# Patient Record
Sex: Female | Born: 1980 | Race: White | Hispanic: No | Marital: Married | Smoking: Never smoker
Health system: Southern US, Community
[De-identification: ages and names within clinical notes are randomized; demographics above are authoritative.]

## PROBLEM LIST (undated history)

## (undated) DIAGNOSIS — S42309A Unspecified fracture of shaft of humerus, unspecified arm, initial encounter for closed fracture: Secondary | ICD-10-CM

## (undated) DIAGNOSIS — Z789 Other specified health status: Secondary | ICD-10-CM

## (undated) DIAGNOSIS — R519 Headache, unspecified: Secondary | ICD-10-CM

## (undated) DIAGNOSIS — G43909 Migraine, unspecified, not intractable, without status migrainosus: Secondary | ICD-10-CM

## (undated) DIAGNOSIS — F419 Anxiety disorder, unspecified: Secondary | ICD-10-CM

## (undated) DIAGNOSIS — F32A Depression, unspecified: Secondary | ICD-10-CM

## (undated) DIAGNOSIS — F329 Major depressive disorder, single episode, unspecified: Secondary | ICD-10-CM

## (undated) DIAGNOSIS — R51 Headache: Secondary | ICD-10-CM

## (undated) HISTORY — DX: Anxiety disorder, unspecified: F41.9

## (undated) HISTORY — DX: Headache: R51

## (undated) HISTORY — DX: Unspecified fracture of shaft of humerus, unspecified arm, initial encounter for closed fracture: S42.309A

## (undated) HISTORY — DX: Major depressive disorder, single episode, unspecified: F32.9

## (undated) HISTORY — PX: WISDOM TOOTH EXTRACTION: SHX21

## (undated) HISTORY — PX: DILATION AND CURETTAGE OF UTERUS: SHX78

## (undated) HISTORY — DX: Depression, unspecified: F32.A

## (undated) HISTORY — DX: Headache, unspecified: R51.9

## (undated) HISTORY — DX: Migraine, unspecified, not intractable, without status migrainosus: G43.909

---

## 2003-01-03 ENCOUNTER — Other Ambulatory Visit: Admission: RE | Admit: 2003-01-03 | Discharge: 2003-01-03 | Payer: Self-pay | Admitting: *Deleted

## 2004-01-10 ENCOUNTER — Other Ambulatory Visit: Admission: RE | Admit: 2004-01-10 | Discharge: 2004-01-10 | Payer: Self-pay | Admitting: Gynecology

## 2005-01-04 ENCOUNTER — Other Ambulatory Visit: Admission: RE | Admit: 2005-01-04 | Discharge: 2005-01-04 | Payer: Self-pay | Admitting: Obstetrics and Gynecology

## 2008-02-04 ENCOUNTER — Emergency Department (HOSPITAL_COMMUNITY): Admission: EM | Admit: 2008-02-04 | Discharge: 2008-02-04 | Payer: Self-pay | Admitting: Family Medicine

## 2008-05-16 ENCOUNTER — Ambulatory Visit (HOSPITAL_COMMUNITY): Admission: RE | Admit: 2008-05-16 | Discharge: 2008-05-16 | Payer: Self-pay | Admitting: Obstetrics and Gynecology

## 2008-05-16 ENCOUNTER — Encounter (INDEPENDENT_AMBULATORY_CARE_PROVIDER_SITE_OTHER): Payer: Self-pay | Admitting: Obstetrics and Gynecology

## 2009-04-30 ENCOUNTER — Emergency Department (HOSPITAL_COMMUNITY): Admission: EM | Admit: 2009-04-30 | Discharge: 2009-04-30 | Payer: Self-pay | Admitting: Emergency Medicine

## 2009-07-17 ENCOUNTER — Inpatient Hospital Stay (HOSPITAL_COMMUNITY): Admission: AD | Admit: 2009-07-17 | Discharge: 2009-07-20 | Payer: Self-pay | Admitting: Obstetrics and Gynecology

## 2009-07-18 ENCOUNTER — Encounter (INDEPENDENT_AMBULATORY_CARE_PROVIDER_SITE_OTHER): Payer: Self-pay | Admitting: Obstetrics and Gynecology

## 2011-04-04 LAB — CBC
HCT: 34.2 % — ABNORMAL LOW (ref 36.0–46.0)
Hemoglobin: 13.4 g/dL (ref 12.0–15.0)
MCHC: 34.5 g/dL (ref 30.0–36.0)
MCV: 89.2 fL (ref 78.0–100.0)
MCV: 89.3 fL (ref 78.0–100.0)
Platelets: 236 10*3/uL (ref 150–400)
RBC: 4.36 MIL/uL (ref 3.87–5.11)
RDW: 13.3 % (ref 11.5–15.5)

## 2011-05-11 NOTE — Op Note (Signed)
NAME:  [name], [name] NO.:  [id number]   MEDICAL RECORD NO.:  [id number]          PATIENT TYPE:  INP   LOCATION:  9111                          FACILITY:  [facility]   PHYSICIAN:  Guy Sandifer. Henderson Cloud, M.D. DATE OF BIRTH:  03-May-1981   DATE OF PROCEDURE:  07/18/2009  DATE OF DISCHARGE:                               OPERATIVE REPORT   PROCEDURE:  Vacuum extraction.   DESCRIPTION OF PROCEDURE:  This patient is a 30 year old married white  female G2, P0 with an EDC of July 18, 2009, who was admitted on the  evening of July 17, 2009, for two-stage induction of labor.  Prenatal  care has been uncomplicated.  She has a positive group B beta strep  culture.  She undergo Cytotec induction.  Antibiotics were started per  beta strep protocol.  At 5 a.m. on July 18, 2009, Cervix was 1-2 cm  dilated, 70% effaced, -3 in vertex.  At 9:55 a.m., the cervix was 2 cm  dilated, 90% effaced, -2 station.  The patient had some decelerations  and good accelerations noted.  Artificial rupture of membranes for clear  fluid was carried out and an internal uterine pressure catheter and  fetal scalp electrode were placed.  The patient was on Pitocin.  As at  5:10 p.m., cervix was 3, 90%, and -2 with adequate labor.  Recheck at  7:10 p.m. reveals 3-4 cm dilation to complete effacement, -2 station.  The patient progresses to complete in pushing.  She had been pushing for  approximately 1-1/2 hours.  Vertex was +3 between pushes.  She was  pushing against the perineal body and not making much progress over the  last approximately 1 hour.  There were variable shape decelerations that  were late in occurrence.  Oxygen was administered.  Fetal heart rate in  the 170s and the maternal temperature was afebrile.  There was a band of  scar tissue presumably from the hymenal caruncle extending from a 12  o'clock position below the urethra to the 8 o'clock position of the  vaginal introitus.  This was not  reducing as the patient pushes.  Therefore, it was taken down by ligating at 2 points with 2-0 Rapide and  transecting the middle.  The patient continues to push.  After  discussion with the patient due to the above factors, lidocaine was  administered and a second-degree midline episiotomy was done.  The  patient pushes for 2 more contractions with some progress although the  fetal tachycardia remained and decelerations remained.  Therefore,  vacuum extraction was discussed with the patient and her husband,  1:40,000 risk of severe morbidity or mortality was reviewed.  All  questions were answered.  The patient straight catheterized.  Kiwi  vacuum extractor was placed.  There was one pop-off.  Over the course of  approximately 3 contractions, the vertex was delivered in the ROA  position.  A nuchal cord was reduced out.  Shoulders then delivered  without difficulty with McRoberts and the above described a second-  degree midline episiotomy.  Oronasal pharynx was suctioned.  Good  cry  and tone was noted.  Cord was clamped and cut.  Viable female infant,  Apgars of 8 and 9 was obtained 1 at 5 minutes.  Arterial cord pH of 7.21  was noted.  Birth weight was pending at time of dictation.  Placenta had  3 vessels intact and sent to Pathology.  The cervix and vagina without  laceration.  The midline episiotomy was repaired in the standard fashion  in layers.  The patient was stable in the labor and delivery room.  Estimated blood loss was 500 mL.  There was a question of the baby's  shoulders being uneven, so it was taken to the nursery for observation.      Guy Sandifer Henderson Cloud, M.D.  Electronically Signed     JET/MEDQ  D:  07/19/2009  T:  07/19/2009  Job:  [id number]

## 2011-05-11 NOTE — Op Note (Signed)
NAME:  [name], [name] NO.:  [id number]   MEDICAL RECORD NO.:  [id number]          PATIENT TYPE:  AMB   LOCATION:  SDC                           [facility]  [facility]   PHYSICIAN:  Duke Salvia. Marcelle Overlie, M.D.DATE OF BIRTH:  04-09-1981   DATE OF PROCEDURE:  DATE OF DISCHARGE:                               OPERATIVE REPORT   PREOPERATIVE DIAGNOSIS:  Missed abortion.   POSTOPERATIVE DIAGNOSIS:  Missed abortion.   PROCEDURE:  Dilation and evacuation.   SURGEON:  Meriel Pica, MD   ANESTHESIA:  Sedation plus paracervical block.   SPECIMENS REMOVED:  Products of conception.   BLEEDING:  Minimal.   PROCEDURE AND FINDINGS:  The patient was brought to the operating room  and after an adequate level of sedation was obtained, the patient's legs  stirrups, perineum and vagina prepped and drape with Betadine.  The  bladder was drained, EUA carried out uterus 7-week size mid position,  adnexa negative.  Speculum was position.  Cervix grasped with tenaculum.  Paracervical block created by infiltrating at 3 and 9 o'clock  submucosally 5-10 mL of 1% Xylocaine on either side after negative  aspiration.  The uterus then sounded to 8 cm, progressively dilated to a  27 Pratt dilator.  Seven curved suction curette was then used to curette  a moderate amount of tissue.  No further tissue could be removed.  A  small blunt curette was used to explore the cavity revealing no further  tissue and the walls were firm.  She had minimal bleeding.  She  tolerated this well, went to recovery room in good condition.      Richard M. Marcelle Overlie, M.D.  Electronically Signed     RMH/MEDQ  D:  05/16/2008  T:  05/17/2008  Job:  [id number]

## 2011-05-11 NOTE — H&P (Signed)
NAME:  [name], [name] NO.:  [id number]   MEDICAL RECORD NO.:  [id number]         PATIENT TYPE:  AMB   LOCATION:                                [facility]   PHYSICIAN:  Duke Salvia. Marcelle Overlie, M.D.DATE OF BIRTH:  March 01, 1981   DATE OF ADMISSION:  05/16/2008  DATE OF DISCHARGE:                              HISTORY & PHYSICAL   CHIEF COMPLAINT:  Missed AB.   HISTORY OF PRESENT ILLNESS:  A 30 year old G1, P0, EDD December 23.  Her  screening ultrasound several weeks ago showed a positive FHR with a slow  heart rate in the 60's.  She has had no spotting or bleeding.  Presented  this week for her new OB appointment. Ultrasound showed a 6 weeks fetus;  resolving corpus luteum, no fetal heart activity noted.  She presents  now for D&E.  This procedure including risks related to bleeding,  infection, other complications may require additional surgery.  Reviewed  with her which she understands and accepts.   ALLERGIES:  None.   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   CURRENT MEDICATIONS:  Her only current medications are prenatal  vitamins.   FAMILY HISTORY:  Please see her hospital form for details of family  history.   PHYSICAL EXAMINATION:  VITAL SIGNS:  Temperature 98.2, blood pressure  130/70.  HEENT:  Unremarkable.  NECK:  Supple without masses.  LUNGS:  Clear.  CARDIOVASCULAR:  Regular rate and rhythm without murmurs, rubs, gallops.  BREASTS:  Without masses.  ABDOMEN:.  Soft, flat, nontender.  PELVIC:  Normal external, vagina clear.  Uterus 7-week size, mid  position.  Adnexa negative.  EXTREMITIES/NEUROLOGIC:  Unremarkable.   IMPRESSION:  Missed abortion.   PLAN:  D&E procedure and risks reviewed as above.      Richard M. Marcelle Overlie, M.D.  Electronically Signed     RMH/MEDQ  D:  05/16/2008  T:  05/16/2008  Job:  [id number]

## 2011-09-22 LAB — CBC
HCT: 37.4
Platelets: 308
RDW: 12.8

## 2012-08-02 ENCOUNTER — Other Ambulatory Visit: Payer: Self-pay

## 2012-08-02 LAB — OB RESULTS CONSOLE RPR: RPR: NONREACTIVE

## 2012-08-02 LAB — OB RESULTS CONSOLE RUBELLA ANTIBODY, IGM: Rubella: IMMUNE

## 2012-08-02 LAB — OB RESULTS CONSOLE ANTIBODY SCREEN: Antibody Screen: NEGATIVE

## 2012-08-02 LAB — OB RESULTS CONSOLE HEPATITIS B SURFACE ANTIGEN: Hepatitis B Surface Ag: NEGATIVE

## 2012-10-16 ENCOUNTER — Other Ambulatory Visit (HOSPITAL_COMMUNITY): Payer: Self-pay | Admitting: Obstetrics and Gynecology

## 2012-10-16 DIAGNOSIS — O283 Abnormal ultrasonic finding on antenatal screening of mother: Secondary | ICD-10-CM

## 2012-10-16 DIAGNOSIS — Z3689 Encounter for other specified antenatal screening: Secondary | ICD-10-CM

## 2012-10-18 ENCOUNTER — Encounter (HOSPITAL_COMMUNITY): Payer: Self-pay

## 2012-10-18 ENCOUNTER — Ambulatory Visit (HOSPITAL_COMMUNITY)
Admission: RE | Admit: 2012-10-18 | Discharge: 2012-10-18 | Disposition: A | Discharge status: Home / Self Care | Payer: BC Managed Care – PPO | Source: Ambulatory Visit | Attending: Obstetrics and Gynecology | Admitting: Obstetrics and Gynecology

## 2012-10-18 VITALS — BP 107/68 | HR 100 | Wt 133.5 lb

## 2012-10-18 DIAGNOSIS — Z3689 Encounter for other specified antenatal screening: Secondary | ICD-10-CM

## 2012-10-18 DIAGNOSIS — Z363 Encounter for antenatal screening for malformations: Secondary | ICD-10-CM | POA: Insufficient documentation

## 2012-10-18 DIAGNOSIS — O283 Abnormal ultrasonic finding on antenatal screening of mother: Secondary | ICD-10-CM

## 2012-10-18 DIAGNOSIS — O358XX0 Maternal care for other (suspected) fetal abnormality and damage, not applicable or unspecified: Secondary | ICD-10-CM | POA: Insufficient documentation

## 2012-10-18 DIAGNOSIS — Z1389 Encounter for screening for other disorder: Secondary | ICD-10-CM | POA: Insufficient documentation

## 2012-10-18 NOTE — Progress Notes (Signed)
[name]  was seen today for an ultrasound appointment.  See full report in [facility].  Alpha Gula, MD  Comments:  [name] is seen today due to a right cystic kidney that was noted on recent ultrasound.  On ultrasound today, an enlarged, right sided multicystic dysplastic kidney is noted.  The right kidney length is 4.4 cm (> 2SD).  Multiple cysts are noted - the larges 1.5 x 1.4 cm.  The left kidney and bladder appear normal.  Normal amniotic fluid volume is appreciated that would suggest a normal functioning left kidney.  The remainder of the fetal anatomy is within normal limits.  Findings and limitations of the study were discussed.    Impression:  Single IUP at 19 5/7 weeks Right multicystic / dysplastic kidney noted Normal left kidney and bladder The remainder of the fetal anatomy is within normal limits Normal amniotic fluid volume  Recommendations:  Recommend follow up ultrasound in 4 weeks for growth and to reevaluate the fetal kidney. Will make arrangements for [facility]rology consult - feel that the patient will be able to deliver in [place], but will likely require evaluation after delivery.

## 2012-11-15 ENCOUNTER — Encounter (HOSPITAL_COMMUNITY): Payer: Self-pay

## 2012-11-15 ENCOUNTER — Ambulatory Visit (HOSPITAL_COMMUNITY)
Admission: RE | Admit: 2012-11-15 | Discharge: 2012-11-15 | Disposition: A | Discharge status: Home / Self Care | Payer: BC Managed Care – PPO | Source: Ambulatory Visit | Attending: Obstetrics and Gynecology | Admitting: Obstetrics and Gynecology

## 2012-11-15 VITALS — BP 112/63 | HR 67 | Wt 136.5 lb

## 2012-11-15 DIAGNOSIS — O283 Abnormal ultrasonic finding on antenatal screening of mother: Secondary | ICD-10-CM

## 2012-11-15 DIAGNOSIS — O358XX0 Maternal care for other (suspected) fetal abnormality and damage, not applicable or unspecified: Secondary | ICD-10-CM | POA: Insufficient documentation

## 2012-11-15 NOTE — Progress Notes (Signed)
[name]  was seen today for an ultrasound appointment.  See full report in [facility].  Impression: Single IUP at 23 5/7 weeks Large right multicystic dysplastic kidney noted (kidney length 6.1 cm - mean for current gestational age 33 cm).  The largest cyst is 4.6 cm in length. Normal left kidney and bladder The remainder of the fetal anatomy is within normal limits Overall fetal growth is appropriate; the AC > 97th%tile, largely due to the enlarged right kidney Normal amniotic fluid volume  Recommendations: Follow up ultrasound in 4 weeks to reevaluate. May require Cesarean delivery due to the enlarged right kidney. Peds urology appointment scheduled next week. Feel that the patient may safely delivery at [facility], but will require Peds urology evaluation after delivery.  Alpha Gula, MD

## 2012-12-13 ENCOUNTER — Ambulatory Visit (HOSPITAL_COMMUNITY): Payer: BC Managed Care – PPO

## 2012-12-14 ENCOUNTER — Ambulatory Visit (HOSPITAL_COMMUNITY)
Admission: RE | Admit: 2012-12-14 | Discharge: 2012-12-14 | Disposition: A | Discharge status: Home / Self Care | Payer: BC Managed Care – PPO | Source: Ambulatory Visit | Attending: Obstetrics and Gynecology | Admitting: Obstetrics and Gynecology

## 2012-12-14 VITALS — BP 125/63 | HR 120 | Wt 143.8 lb

## 2012-12-14 DIAGNOSIS — O358XX0 Maternal care for other (suspected) fetal abnormality and damage, not applicable or unspecified: Secondary | ICD-10-CM | POA: Insufficient documentation

## 2012-12-14 DIAGNOSIS — O283 Abnormal ultrasonic finding on antenatal screening of mother: Secondary | ICD-10-CM

## 2012-12-14 DIAGNOSIS — IMO0002 Reserved for concepts with insufficient information to code with codable children: Secondary | ICD-10-CM

## 2012-12-14 DIAGNOSIS — Z3689 Encounter for other specified antenatal screening: Secondary | ICD-10-CM | POA: Insufficient documentation

## 2012-12-14 NOTE — Progress Notes (Signed)
[facility]  Indication: 31 yr old G3P1011 at [redacted]w[redacted]d with right multicystic enlarged kidney for follow up ultrasound.  Findings: 1. Single intrauterine pregnancy. 2. Estimated fetal weight is in the 82nd%. The abdominal circumference is in the >97th%. 3. Anterior placenta without evidence of previa. 4. Normal amniotic fluid index. 5. Again seen is an enlarged multicystic right kidney. There are several large cysts the largest measuring 6cm. 6. The remainder of the limited anatomy survey is normal. The left kidney appears normal.  Recommendations: 1. Appropriate fetal growth. Accelerated abdominal circumference is likely due to the enlarged kidney. Recommend reevaluate closer to delivery for recommendation on mode of delivery. 2. Multicystic kidney: - previously counseled - has met with Pediatric Urology- needs neonatal follow up - recommend follow up every 2 week to evaluate amniotic fluid index - recommend fetal growth in 4 weeks  Eulis Foster, MD

## 2012-12-28 ENCOUNTER — Ambulatory Visit (HOSPITAL_COMMUNITY)
Admission: RE | Admit: 2012-12-28 | Discharge: 2012-12-28 | Disposition: A | Discharge status: Home / Self Care | Payer: BC Managed Care – PPO | Source: Ambulatory Visit | Attending: Obstetrics and Gynecology | Admitting: Obstetrics and Gynecology

## 2012-12-28 DIAGNOSIS — Z3689 Encounter for other specified antenatal screening: Secondary | ICD-10-CM | POA: Insufficient documentation

## 2012-12-28 DIAGNOSIS — IMO0002 Reserved for concepts with insufficient information to code with codable children: Secondary | ICD-10-CM

## 2012-12-28 DIAGNOSIS — O358XX0 Maternal care for other (suspected) fetal abnormality and damage, not applicable or unspecified: Secondary | ICD-10-CM | POA: Insufficient documentation

## 2012-12-28 NOTE — Progress Notes (Signed)
IVELIS NORGARD  was seen today for an ultrasound appointment.  See full report in [facility].  Impression: Single IUP at 29 6/7 weeks Large right multicystic dysplastic kidney; normal left kidney and bladder Limited ultrasound performed for amniotic fluid volume assessment - AFI: 19 cm. Patient seen by [facility] urology - plan delivery at [facility] with [facility] urology evaluation after delivery  Recommendations: Recommend follow-up ultrasound examination in 2 weeks for interval growth and to reasses the fetal kidneys.  Alpha Gula, MD

## 2013-01-08 ENCOUNTER — Other Ambulatory Visit (HOSPITAL_COMMUNITY): Payer: Self-pay | Admitting: Obstetrics and Gynecology

## 2013-01-08 DIAGNOSIS — O358XX0 Maternal care for other (suspected) fetal abnormality and damage, not applicable or unspecified: Secondary | ICD-10-CM

## 2013-01-11 ENCOUNTER — Ambulatory Visit (HOSPITAL_COMMUNITY)
Admission: RE | Admit: 2013-01-11 | Discharge: 2013-01-11 | Disposition: A | Discharge status: Home / Self Care | Payer: BC Managed Care – PPO | Source: Ambulatory Visit | Attending: Obstetrics and Gynecology | Admitting: Obstetrics and Gynecology

## 2013-01-11 VITALS — BP 107/67 | HR 85 | Wt 143.0 lb

## 2013-01-11 DIAGNOSIS — O358XX0 Maternal care for other (suspected) fetal abnormality and damage, not applicable or unspecified: Secondary | ICD-10-CM | POA: Insufficient documentation

## 2013-01-11 DIAGNOSIS — Z3689 Encounter for other specified antenatal screening: Secondary | ICD-10-CM | POA: Insufficient documentation

## 2013-01-11 NOTE — Progress Notes (Signed)
[name]  was seen today for an ultrasound appointment.  See full report in [facility].  Impression: Single IUP at 31 6/7 weeks Large right multicystic dysplastic kidney; normal left kidney and bladder The estimated fetal weight today is >90th %tile, but mostly secondary to the enlarged AC (> 97th %tile) due to the enlarged right kidney The remainder of the fetal anatomy is normal Normal amniotic fluid volume  Recommendations: Recommend follow-up ultrasound examination in 2 weeks amniotic fluid assessment and 4 weeks for interval growth. Based on enlarged [facility], will likely require cesarean delivery - will reassess at next growth scan Patient was seen by Peds Urology - plans delivery at [facility] with neonatal follow up.  Alpha Gula, MD

## 2013-01-25 ENCOUNTER — Other Ambulatory Visit (HOSPITAL_COMMUNITY): Payer: Self-pay | Admitting: Maternal and Fetal Medicine

## 2013-01-25 ENCOUNTER — Ambulatory Visit (HOSPITAL_COMMUNITY)
Admission: RE | Admit: 2013-01-25 | Discharge: 2013-01-25 | Disposition: A | Discharge status: Home / Self Care | Payer: BC Managed Care – PPO | Source: Ambulatory Visit | Attending: Obstetrics and Gynecology | Admitting: Obstetrics and Gynecology

## 2013-01-25 ENCOUNTER — Encounter (HOSPITAL_COMMUNITY): Payer: Self-pay

## 2013-01-25 VITALS — BP 127/73 | HR 100 | Wt 150.0 lb

## 2013-01-25 DIAGNOSIS — O358XX0 Maternal care for other (suspected) fetal abnormality and damage, not applicable or unspecified: Secondary | ICD-10-CM | POA: Insufficient documentation

## 2013-01-25 NOTE — Progress Notes (Signed)
[name]  was seen today for an ultrasound appointment.  See full report in [facility].  Impression: Single IUP at 33 6/7 weeks Large right multicystic dysplastic kidney; normal left kidney and bladder The estimated fetal weight today is again  >90th %tile, but mostly secondary to the enlarged AC (> 97th %tile) due to the enlarged right kidney The remainder of the fetal anatomy is normal Normal amniotic fluid volume  Recommendations: Recommend weekly BPPs/ assessment of amniotic fluid volumes- assuming normal amniotic fluid volumes, early delivery is not recommended. Based on enlarged AC (> 97th %tile), recommend cesarean delivery at 39 weeks to avoid abdominal trauma / abdominal dystocia. Follow up ultrasound for growth in 3-4 weeks.  Alpha Gula, MD

## 2013-01-26 ENCOUNTER — Other Ambulatory Visit (HOSPITAL_COMMUNITY): Payer: Self-pay | Admitting: Maternal and Fetal Medicine

## 2013-01-26 DIAGNOSIS — O358XX0 Maternal care for other (suspected) fetal abnormality and damage, not applicable or unspecified: Secondary | ICD-10-CM

## 2013-02-02 ENCOUNTER — Encounter (HOSPITAL_COMMUNITY): Payer: Self-pay

## 2013-02-02 ENCOUNTER — Ambulatory Visit (HOSPITAL_COMMUNITY)
Admission: RE | Admit: 2013-02-02 | Discharge: 2013-02-02 | Disposition: A | Discharge status: Home / Self Care | Payer: BC Managed Care – PPO | Source: Ambulatory Visit | Attending: Obstetrics and Gynecology | Admitting: Obstetrics and Gynecology

## 2013-02-02 DIAGNOSIS — O358XX0 Maternal care for other (suspected) fetal abnormality and damage, not applicable or unspecified: Secondary | ICD-10-CM | POA: Insufficient documentation

## 2013-02-02 NOTE — Progress Notes (Signed)
[name]  was seen today for an ultrasound appointment.  See full report in [facility].  Impression: Single IUP at 35 0/7 weeks Large right multicystic dysplastic kidney; normal left kidney and bladder Active fetus with BPP of 8/8 Normal amniotic fluid volume  Recommendations: Continue weekly BPPs.  Plan scheduled C-section at 39 weeks.  Alpha Gula, MD

## 2013-02-07 ENCOUNTER — Ambulatory Visit (HOSPITAL_COMMUNITY): Admission: RE | Admit: 2013-02-07 | Payer: BC Managed Care – PPO | Source: Ambulatory Visit

## 2013-02-08 ENCOUNTER — Inpatient Hospital Stay (HOSPITAL_COMMUNITY): Admission: RE | Admit: 2013-02-08 | Payer: BC Managed Care – PPO | Source: Ambulatory Visit

## 2013-02-09 ENCOUNTER — Ambulatory Visit (HOSPITAL_COMMUNITY): Admission: RE | Admit: 2013-02-09 | Payer: BC Managed Care – PPO | Source: Ambulatory Visit

## 2013-02-16 ENCOUNTER — Ambulatory Visit (HOSPITAL_COMMUNITY)
Admission: RE | Admit: 2013-02-16 | Discharge: 2013-02-16 | Disposition: A | Discharge status: Home / Self Care | Payer: BC Managed Care – PPO | Source: Ambulatory Visit | Attending: Obstetrics and Gynecology | Admitting: Obstetrics and Gynecology

## 2013-02-16 ENCOUNTER — Encounter (HOSPITAL_COMMUNITY): Payer: Self-pay

## 2013-02-16 DIAGNOSIS — O358XX0 Maternal care for other (suspected) fetal abnormality and damage, not applicable or unspecified: Secondary | ICD-10-CM | POA: Insufficient documentation

## 2013-02-16 DIAGNOSIS — Z3689 Encounter for other specified antenatal screening: Secondary | ICD-10-CM | POA: Insufficient documentation

## 2013-02-16 NOTE — Progress Notes (Signed)
[facility]  Indication: 32 yr old G3P1011 at [redacted]w[redacted]d with right multicystic enlarged kidney for follow up ultrasound.  Findings: 1. Single intrauterine pregnancy. 2. Estimated fetal weight is in the >90th%. The abdominal circumference is in the >97th%. 3. Anterior placenta without evidence of previa. 4. Normal amniotic fluid index. 5. Again seen is an enlarged multicystic right kidney. There are several large cysts the largest measuring 6cm. 6. The remainder of the limited anatomy survey is normal. The left kidney appears normal. 7. Normal biophysical profile of 8/8.  Recommendations: 1. Appropriate fetal growth overall with accelerated abdominal circumference. Accelerated abdominal circumference is likely due to the enlarged kidney. Patient having C section at 39 weeks. 2. Multicystic kidney: - previously counseled - has met with Pediatric Urology- needs neonatal follow up - recommend continue antenatal testing   Eulis Foster, MD

## 2013-02-19 ENCOUNTER — Encounter (HOSPITAL_COMMUNITY): Payer: Self-pay | Admitting: Pharmacist

## 2013-02-20 NOTE — H&P (Signed)
NAME:  [name], [name] NO.:  [id number]  MEDICAL RECORD NO.:  [id number]  LOCATION:  MFM                           [facility]ILITY:  [facility]  PHYSICIAN:  Duke Salvia. Marcelle Overlie, M.D.DATE OF BIRTH:  27-Jul-1981  DATE OF ADMISSION:  02/16/2013 DATE OF DISCHARGE:  02/21/2014LH                             HISTORY & PHYSICAL   CHIEF COMPLAINT:  Primary cesarean section and tubal at term, fetal multicystic kidney.  HISTORY OF PRESENT ILLNESS:  A 32 year old, G3, P1-0-1-1 EDD March 09, 2013, presents at 39 weeks for primary cesarean section and tubal ligation.  Primary cesarean section has been recommended by MFM who has been following her carefully with this during this pregnancy for fetal multicystic kidney.  She has had weekly ultrasounds by MFM along with nonstress testing that has been reactive.  One hour GTT was normal at 114.  First trimester screen along with CF screen were normal.  She is O positive.  PAST MEDICAL HISTORY:  Allergies none.  PAST SURGICAL HISTORY:  D and E in 2009, vaginal delivery in 2010.  For the remainder of her past medical history, social and family history, please see her [facility] form.  PHYSICAL EXAMINATION:  VITAL SIGNS:  Temp 98.2, blood pressure 120/72. HEENT:  Unremarkable. NECK:  Supple without masses. LUNGS:  Clear. CARDIOVASCULAR:  Regular rate and rhythm without murmurs, rubs, or gallop sound.  BREASTS:  Without masses, term fundal height.  Fetal heart rate 140, cervix is closed.  EXTREMITIES:  Unremarkable. NEUROLOGIC:  Unremarkable.  IMPRESSION:  Term pregnancy, fetal multicystic kidney.  PLAN:  Primary cesarean section.  Tubal ligation.  This procedure including specific risks related to bleeding, infection, transfusion, adjacent organ injury along, with her expected recovery time discussed. The permanence of the tubal procedure and failure rated 2 to 02/999 reviewed which she understands and accepts.     Everette Dimauro M. Marcelle Overlie,  M.D.     RMH/MEDQ  D:  02/20/2013  T:  02/20/2013  Job:  [id number]

## 2013-02-20 NOTE — H&P (Signed)
[name]  DICTATION # [id number] CSN# [id number]   Meriel Pica, MD 02/20/2013 8:47 AM

## 2013-02-21 ENCOUNTER — Other Ambulatory Visit (HOSPITAL_COMMUNITY): Payer: Self-pay | Admitting: Obstetrics and Gynecology

## 2013-02-22 ENCOUNTER — Ambulatory Visit (HOSPITAL_COMMUNITY)
Admission: RE | Admit: 2013-02-22 | Discharge: 2013-02-22 | Disposition: A | Discharge status: Home / Self Care | Payer: BC Managed Care – PPO | Source: Ambulatory Visit | Attending: Obstetrics and Gynecology | Admitting: Obstetrics and Gynecology

## 2013-02-22 VITALS — BP 115/68 | HR 60 | Wt 152.5 lb

## 2013-02-22 DIAGNOSIS — O358XX0 Maternal care for other (suspected) fetal abnormality and damage, not applicable or unspecified: Secondary | ICD-10-CM | POA: Insufficient documentation

## 2013-03-01 ENCOUNTER — Encounter (HOSPITAL_COMMUNITY): Payer: Self-pay

## 2013-03-01 ENCOUNTER — Encounter (HOSPITAL_COMMUNITY)
Admission: RE | Admit: 2013-03-01 | Discharge: 2013-03-01 | Disposition: A | Discharge status: Home / Self Care | Payer: BC Managed Care – PPO | Source: Ambulatory Visit | Attending: Obstetrics and Gynecology | Admitting: Obstetrics and Gynecology

## 2013-03-01 HISTORY — DX: Other specified health status: Z78.9

## 2013-03-01 LAB — TYPE AND SCREEN

## 2013-03-01 LAB — CBC
Hemoglobin: 13.6 g/dL (ref 12.0–15.0)
MCH: 30.1 pg (ref 26.0–34.0)
MCV: 88.3 fL (ref 78.0–100.0)
RBC: 4.52 MIL/uL (ref 3.87–5.11)

## 2013-03-01 LAB — RPR: RPR Ser Ql: NONREACTIVE

## 2013-03-01 LAB — ABO/RH: ABO/RH(D): O POS

## 2013-03-01 NOTE — Patient Instructions (Addendum)
20 IZA PRESTON  03/01/2013   Your procedure is scheduled on:  03/05/13  Enter through the Main Entrance of [facility] at 1130 AM.  Pick up the phone at the desk and dial [phone].   Call this number if you have problems the morning of surgery: [phone]   Remember:   Do not eat food:After Midnight.  Do not drink clear liquids: 4 Hours before arrival.  Take these medicines the morning of surgery with A SIP OF WATER: NA   Do not wear jewelry, make-up or nail polish.  Do not wear lotions, powders, or perfumes. You may wear deodorant.  Do not shave 48 hours prior to surgery.  Do not bring valuables to the hospital.  Contacts, dentures or bridgework may not be worn into surgery.  Leave suitcase in the car. After surgery it may be brought to your room.  For patients admitted to the hospital, checkout time is 11:00 AM the day of discharge.   Patients discharged the day of surgery will not be allowed to drive home.  Name and phone number of your driver: NA  Special Instructions: Shower using CHG 2 nights before surgery and the night before surgery.  If you shower the day of surgery use CHG.  Use special wash - you have one bottle of CHG for all showers.  You should use approximately 1/3 of the bottle for each shower.   Please read over the following fact sheets that you were given: Surgical Site Infection Prevention

## 2013-03-02 ENCOUNTER — Ambulatory Visit (HOSPITAL_COMMUNITY): Admission: RE | Admit: 2013-03-02 | Payer: BC Managed Care – PPO | Source: Ambulatory Visit

## 2013-03-05 ENCOUNTER — Encounter (HOSPITAL_COMMUNITY)
Admission: RE | Disposition: A | Discharge status: Home / Self Care | Payer: Self-pay | Source: Ambulatory Visit | Attending: Obstetrics and Gynecology

## 2013-03-05 ENCOUNTER — Inpatient Hospital Stay (HOSPITAL_COMMUNITY): Payer: BC Managed Care – PPO | Admitting: Anesthesiology

## 2013-03-05 ENCOUNTER — Inpatient Hospital Stay (HOSPITAL_COMMUNITY)
Admission: RE | Admit: 2013-03-05 | Discharge: 2013-03-07 | DRG: 371 | Disposition: A | Discharge status: Home / Self Care | Payer: BC Managed Care – PPO | Source: Ambulatory Visit | Attending: Obstetrics and Gynecology | Admitting: Obstetrics and Gynecology

## 2013-03-05 ENCOUNTER — Encounter (HOSPITAL_COMMUNITY): Payer: Self-pay | Admitting: Anesthesiology

## 2013-03-05 ENCOUNTER — Encounter (HOSPITAL_COMMUNITY): Payer: Self-pay | Admitting: *Deleted

## 2013-03-05 DIAGNOSIS — Z302 Encounter for sterilization: Secondary | ICD-10-CM

## 2013-03-05 DIAGNOSIS — O358XX0 Maternal care for other (suspected) fetal abnormality and damage, not applicable or unspecified: Principal | ICD-10-CM | POA: Diagnosis present

## 2013-03-05 LAB — TYPE AND SCREEN: Antibody Screen: NEGATIVE

## 2013-03-05 SURGERY — Surgical Case
Anesthesia: Spinal | Site: Abdomen | Laterality: Bilateral | Wound class: Clean Contaminated

## 2013-03-05 MED ORDER — NALBUPHINE SYRINGE 5 MG/0.5 ML
5.0000 mg | INJECTION | INTRAMUSCULAR | Status: DC | PRN
  Filled 2013-03-05: qty 1

## 2013-03-05 MED ORDER — OXYCODONE-ACETAMINOPHEN 5-325 MG PO TABS
1.0000 | ORAL_TABLET | Freq: Four times a day (QID) | ORAL | Status: DC | PRN
  Administered 2013-03-06 (×2): 2 via ORAL
  Administered 2013-03-06: 1 via ORAL
  Filled 2013-03-05: qty 2
  Filled 2013-03-05: qty 1
  Filled 2013-03-05: qty 2

## 2013-03-05 MED ORDER — PHENYLEPHRINE 40 MCG/ML (10ML) SYRINGE FOR IV PUSH (FOR BLOOD PRESSURE SUPPORT)
PREFILLED_SYRINGE | INTRAVENOUS | Status: AC
  Filled 2013-03-05: qty 5

## 2013-03-05 MED ORDER — MEPERIDINE HCL 25 MG/ML IJ SOLN: 6.2500 mg | INTRAMUSCULAR | Status: DC | PRN

## 2013-03-05 MED ORDER — CEFAZOLIN SODIUM-DEXTROSE 2-3 GM-% IV SOLR
2.0000 g | INTRAVENOUS | Status: AC
  Administered 2013-03-05: 2 g via INTRAVENOUS

## 2013-03-05 MED ORDER — FENTANYL CITRATE 0.05 MG/ML IJ SOLN: 25.0000 ug | INTRAMUSCULAR | Status: DC | PRN

## 2013-03-05 MED ORDER — PHENYLEPHRINE HCL 10 MG/ML IJ SOLN
INTRAMUSCULAR | Status: DC | PRN
  Administered 2013-03-05: 80 ug via INTRAVENOUS
  Administered 2013-03-05 (×2): 40 ug via INTRAVENOUS
  Administered 2013-03-05 (×2): 80 ug via INTRAVENOUS
  Administered 2013-03-05: 40 ug via INTRAVENOUS
  Administered 2013-03-05: 80 ug via INTRAVENOUS
  Administered 2013-03-05: 40 ug via INTRAVENOUS
  Administered 2013-03-05: 80 ug via INTRAVENOUS

## 2013-03-05 MED ORDER — DIBUCAINE 1 % RE OINT: 1.0000 "application " | TOPICAL_OINTMENT | RECTAL | Status: DC | PRN

## 2013-03-05 MED ORDER — LANOLIN HYDROUS EX OINT: 1.0000 "application " | TOPICAL_OINTMENT | CUTANEOUS | Status: DC | PRN

## 2013-03-05 MED ORDER — IBUPROFEN 800 MG PO TABS: 800.0000 mg | ORAL_TABLET | Freq: Three times a day (TID) | ORAL | Status: DC | PRN

## 2013-03-05 MED ORDER — SODIUM CHLORIDE 0.9 % IJ SOLN: 3.0000 mL | INTRAMUSCULAR | Status: DC | PRN

## 2013-03-05 MED ORDER — PRENATAL MULTIVITAMIN CH
1.0000 | ORAL_TABLET | Freq: Every day | ORAL | Status: DC
  Administered 2013-03-06 – 2013-03-07 (×2): 1 via ORAL
  Filled 2013-03-05 (×2): qty 1

## 2013-03-05 MED ORDER — DIPHENHYDRAMINE HCL 50 MG/ML IJ SOLN: 25.0000 mg | INTRAMUSCULAR | Status: DC | PRN

## 2013-03-05 MED ORDER — LACTATED RINGERS IV SOLN
INTRAVENOUS | Status: DC | PRN
  Administered 2013-03-05 (×2): via INTRAVENOUS

## 2013-03-05 MED ORDER — KETOROLAC TROMETHAMINE 30 MG/ML IJ SOLN
INTRAMUSCULAR | Status: AC
  Administered 2013-03-05: 30 mg via INTRAMUSCULAR
  Filled 2013-03-05: qty 1

## 2013-03-05 MED ORDER — SODIUM CHLORIDE 0.9 % IJ SOLN: 3.0000 mL | Freq: Two times a day (BID) | INTRAMUSCULAR | Status: DC

## 2013-03-05 MED ORDER — CEFAZOLIN SODIUM-DEXTROSE 2-3 GM-% IV SOLR
INTRAVENOUS | Status: AC
  Filled 2013-03-05: qty 50

## 2013-03-05 MED ORDER — WITCH HAZEL-GLYCERIN EX PADS: 1.0000 "application " | MEDICATED_PAD | CUTANEOUS | Status: DC | PRN

## 2013-03-05 MED ORDER — SENNOSIDES-DOCUSATE SODIUM 8.6-50 MG PO TABS
2.0000 | ORAL_TABLET | Freq: Every day | ORAL | Status: DC
  Administered 2013-03-05 – 2013-03-06 (×2): 2 via ORAL

## 2013-03-05 MED ORDER — DIPHENHYDRAMINE HCL 25 MG PO CAPS
25.0000 mg | ORAL_CAPSULE | ORAL | Status: DC | PRN
  Filled 2013-03-05: qty 1

## 2013-03-05 MED ORDER — SCOPOLAMINE 1 MG/3DAYS TD PT72
MEDICATED_PATCH | TRANSDERMAL | Status: AC
  Administered 2013-03-05: 1.5 mg via TRANSDERMAL
  Filled 2013-03-05: qty 1

## 2013-03-05 MED ORDER — KETOROLAC TROMETHAMINE 30 MG/ML IJ SOLN
30.0000 mg | Freq: Four times a day (QID) | INTRAMUSCULAR | Status: AC | PRN
  Administered 2013-03-05: 30 mg via INTRAVENOUS
  Filled 2013-03-05: qty 1

## 2013-03-05 MED ORDER — BISACODYL 10 MG RE SUPP: 10.0000 mg | Freq: Every day | RECTAL | Status: DC | PRN

## 2013-03-05 MED ORDER — ONDANSETRON HCL 4 MG/2ML IJ SOLN
INTRAMUSCULAR | Status: DC | PRN
  Administered 2013-03-05: 4 mg via INTRAVENOUS

## 2013-03-05 MED ORDER — LACTATED RINGERS IV SOLN
Freq: Once | INTRAVENOUS | Status: AC
  Administered 2013-03-05: 13:00:00 via INTRAVENOUS

## 2013-03-05 MED ORDER — ONDANSETRON HCL 4 MG/2ML IJ SOLN
INTRAMUSCULAR | Status: AC
  Filled 2013-03-05: qty 2

## 2013-03-05 MED ORDER — SODIUM CHLORIDE 0.9 % IV SOLN: 250.0000 mL | INTRAVENOUS | Status: DC

## 2013-03-05 MED ORDER — TETANUS-DIPHTH-ACELL PERTUSSIS 5-2.5-18.5 LF-MCG/0.5 IM SUSP: 0.5000 mL | Freq: Once | INTRAMUSCULAR | Status: DC

## 2013-03-05 MED ORDER — KETOROLAC TROMETHAMINE 30 MG/ML IJ SOLN: 30.0000 mg | Freq: Four times a day (QID) | INTRAMUSCULAR | Status: AC | PRN

## 2013-03-05 MED ORDER — SIMETHICONE 80 MG PO CHEW: 80.0000 mg | CHEWABLE_TABLET | ORAL | Status: DC | PRN

## 2013-03-05 MED ORDER — DIPHENHYDRAMINE HCL 50 MG/ML IJ SOLN: 12.5000 mg | INTRAMUSCULAR | Status: DC | PRN

## 2013-03-05 MED ORDER — IBUPROFEN 600 MG PO TABS
600.0000 mg | ORAL_TABLET | Freq: Four times a day (QID) | ORAL | Status: DC | PRN
  Administered 2013-03-06 – 2013-03-07 (×5): 600 mg via ORAL
  Filled 2013-03-05 (×6): qty 1

## 2013-03-05 MED ORDER — DIPHENHYDRAMINE HCL 25 MG PO CAPS: 25.0000 mg | ORAL_CAPSULE | Freq: Four times a day (QID) | ORAL | Status: DC | PRN

## 2013-03-05 MED ORDER — EPHEDRINE 5 MG/ML INJ
INTRAVENOUS | Status: AC
  Filled 2013-03-05: qty 10

## 2013-03-05 MED ORDER — OXYTOCIN 40 UNITS IN LACTATED RINGERS INFUSION - SIMPLE MED: 62.5000 mL/h | INTRAVENOUS | Status: AC

## 2013-03-05 MED ORDER — BUPIVACAINE IN DEXTROSE 0.75-8.25 % IT SOLN
INTRATHECAL | Status: DC | PRN
  Administered 2013-03-05: 1.5 mL via INTRATHECAL

## 2013-03-05 MED ORDER — ZOLPIDEM TARTRATE 5 MG PO TABS: 5.0000 mg | ORAL_TABLET | Freq: Every evening | ORAL | Status: DC | PRN

## 2013-03-05 MED ORDER — METOCLOPRAMIDE HCL 5 MG/ML IJ SOLN: 10.0000 mg | Freq: Three times a day (TID) | INTRAMUSCULAR | Status: DC | PRN

## 2013-03-05 MED ORDER — LACTATED RINGERS IV SOLN
INTRAVENOUS | Status: DC | PRN
  Administered 2013-03-05: 13:00:00 via INTRAVENOUS

## 2013-03-05 MED ORDER — OXYTOCIN 10 UNIT/ML IJ SOLN
INTRAMUSCULAR | Status: AC
  Filled 2013-03-05: qty 4

## 2013-03-05 MED ORDER — FENTANYL CITRATE 0.05 MG/ML IJ SOLN
INTRAMUSCULAR | Status: AC
  Filled 2013-03-05: qty 2

## 2013-03-05 MED ORDER — NALOXONE HCL 1 MG/ML IJ SOLN
1.0000 ug/kg/h | INTRAVENOUS | Status: DC | PRN
  Filled 2013-03-05: qty 2

## 2013-03-05 MED ORDER — OXYTOCIN 10 UNIT/ML IJ SOLN
40.0000 [IU] | INTRAVENOUS | Status: DC | PRN
  Administered 2013-03-05: 40 [IU] via INTRAVENOUS

## 2013-03-05 MED ORDER — FLEET ENEMA 7-19 GM/118ML RE ENEM: 1.0000 | ENEMA | Freq: Every day | RECTAL | Status: DC | PRN

## 2013-03-05 MED ORDER — SCOPOLAMINE 1 MG/3DAYS TD PT72
1.0000 | MEDICATED_PATCH | Freq: Once | TRANSDERMAL | Status: DC
Start: 2013-03-05 — End: 2013-03-05

## 2013-03-05 MED ORDER — MEASLES, MUMPS & RUBELLA VAC ~~LOC~~ INJ: 0.5000 mL | INJECTION | Freq: Once | SUBCUTANEOUS | Status: DC

## 2013-03-05 MED ORDER — ONDANSETRON HCL 4 MG/2ML IJ SOLN: 4.0000 mg | Freq: Three times a day (TID) | INTRAMUSCULAR | Status: DC | PRN

## 2013-03-05 MED ORDER — MORPHINE SULFATE (PF) 0.5 MG/ML IJ SOLN
INTRAMUSCULAR | Status: DC | PRN
  Administered 2013-03-05: .15 mg via INTRATHECAL

## 2013-03-05 MED ORDER — FENTANYL CITRATE 0.05 MG/ML IJ SOLN
INTRAMUSCULAR | Status: DC | PRN
  Administered 2013-03-05: 25 ug via INTRATHECAL

## 2013-03-05 MED ORDER — SIMETHICONE 80 MG PO CHEW
80.0000 mg | CHEWABLE_TABLET | Freq: Three times a day (TID) | ORAL | Status: DC
  Administered 2013-03-05 – 2013-03-07 (×5): 80 mg via ORAL

## 2013-03-05 MED ORDER — NALOXONE HCL 0.4 MG/ML IJ SOLN: 0.4000 mg | INTRAMUSCULAR | Status: DC | PRN

## 2013-03-05 MED ORDER — MORPHINE SULFATE 0.5 MG/ML IJ SOLN
INTRAMUSCULAR | Status: AC
  Filled 2013-03-05: qty 10

## 2013-03-05 MED ORDER — EPHEDRINE SULFATE 50 MG/ML IJ SOLN
INTRAMUSCULAR | Status: DC | PRN
  Administered 2013-03-05: 10 mg via INTRAVENOUS
  Administered 2013-03-05 (×2): 5 mg via INTRAVENOUS
  Administered 2013-03-05: 10 mg via INTRAVENOUS

## 2013-03-05 MED ORDER — MENTHOL 3 MG MT LOZG: 1.0000 | LOZENGE | OROMUCOSAL | Status: DC | PRN

## 2013-03-05 SURGICAL SUPPLY — 28 items
CLIP FILSHIE TUBAL LIGA STRL (Clip) ×1 IMPLANT
CLOTH BEACON ORANGE TIMEOUT ST (SAFETY) ×2 IMPLANT
DRAPE LG THREE QUARTER DISP (DRAPES) ×2 IMPLANT
DRSG OPSITE POSTOP 4X10 (GAUZE/BANDAGES/DRESSINGS) ×2 IMPLANT
DURAPREP 26ML APPLICATOR (WOUND CARE) ×2 IMPLANT
ELECT REM PT RETURN 9FT ADLT (ELECTROSURGICAL) ×2
ELECTRODE REM PT RTRN 9FT ADLT (ELECTROSURGICAL) ×1 IMPLANT
EXTRACTOR VACUUM M CUP 4 TUBE (SUCTIONS) IMPLANT
GLOVE INDICATOR 7.0 STRL GRN (GLOVE) ×1 IMPLANT
GLOVE NEODERM STER SZ 7 (GLOVE) ×1 IMPLANT
GLOVE SURG SS PI 7.0 STRL IVOR (GLOVE) ×3 IMPLANT
GOWN STRL REIN XL XLG (GOWN DISPOSABLE) ×5 IMPLANT
KIT ABG SYR 3ML LUER SLIP (SYRINGE) ×1 IMPLANT
NDL HYPO 25X5/8 SAFETYGLIDE (NEEDLE) ×1 IMPLANT
NEEDLE HYPO 25X5/8 SAFETYGLIDE (NEEDLE) ×2 IMPLANT
NS IRRIG 1000ML POUR BTL (IV SOLUTION) ×3 IMPLANT
PACK C SECTION WH (CUSTOM PROCEDURE TRAY) ×2 IMPLANT
PAD OB MATERNITY 4.3X12.25 (PERSONAL CARE ITEMS) ×2 IMPLANT
SLEEVE SCD COMPRESS KNEE MED (MISCELLANEOUS) IMPLANT
STRIP CLOSURE SKIN 1/2X4 (GAUZE/BANDAGES/DRESSINGS) ×1 IMPLANT
SUT CHROMIC 0 CTX 36 (SUTURE) ×6 IMPLANT
SUT MON AB 4-0 PS1 27 (SUTURE) ×2 IMPLANT
SUT PDS AB 0 CT1 27 (SUTURE) ×4 IMPLANT
SUT VIC AB 3-0 CT1 27 (SUTURE) ×4
SUT VIC AB 3-0 CT1 TAPERPNT 27 (SUTURE) ×2 IMPLANT
TOWEL OR 17X24 6PK STRL BLUE (TOWEL DISPOSABLE) ×7 IMPLANT
TRAY FOLEY BAG SILVER LF 14FR (CATHETERS) ×1 IMPLANT
WATER STERILE IRR 1000ML POUR (IV SOLUTION) ×2 IMPLANT

## 2013-03-05 NOTE — Anesthesia Postprocedure Evaluation (Signed)
  Anesthesia Post-op Note  Patient: [name]  Procedure(s) Performed: Procedure(s) with comments: CESAREAN SECTION WITH BILATERAL TUBAL LIGATION (Bilateral) - Primary edc 03/09/13  Patient Location: PACU  Anesthesia Type:Spinal  Level of Consciousness: awake, alert  and oriented  Airway and Oxygen Therapy: Patient Spontanous Breathing  Post-op Pain: none  Post-op Assessment: Post-op Vital signs reviewed, Patient's Cardiovascular Status Stable, Respiratory Function Stable, Patent Airway, No signs of Nausea or vomiting, Pain level controlled, No headache, No backache, No residual numbness and No residual motor weakness  Post-op Vital Signs: Reviewed and stable  Complications: No apparent anesthesia complications

## 2013-03-05 NOTE — Progress Notes (Signed)
The patient was re-examined with no change in status 

## 2013-03-05 NOTE — Op Note (Signed)
Preoperative diagnosis: 1. Term pregnancy, fetal multicystic kidney 2. Request permanent sterilization  Postoperative diagnosis: Same  Procedure: 1. Primary low transverse cesarean section 2. Filshie clip tubal ligation  Surgeon: Marcelle Overlie  EBL: 700 cc  Complications: None  Anesthesia: Spinal  Procedure and findings:  Patient was taken the operating room after an adequate level of spinal anesthetic was obtained with the patient in left tilt position the abdomen prepped and draped in usual fashion for cesarean section, Foley catheter positioned draining clear urine. Appropriate timeout taken at that point. Transverse Pfannenstiel incision was made 2 finger breaths above the symphysis carried down to the fascia which was incised and extended transversely. Rectus muscles divided in the midline, peritoneum entered superiorly without incident and extended in a vertical fashion. Bladder blade was positioned, the vesicouterine serosa was incised and the bladder was bluntly and sharply dissected below, bladder blade was repositioned at that point. Transverse incision made lower segment which is fairly thin, extended with blunt dissection vertex presenting the patient then delivered of a healthy female. The infant was suctioned cord clamped and passed the pediatric team for further care. Cord pH was sent, placenta removed spontaneously intact, uterus exteriorized cavity wiped clean with laparotomy pack closure transversely of 0 chromic in a locked fashion followed by an imbricating layer of 0 chromic. This was hemostatic bilateral tubes and ovaries were unremarkable careful inspection of the bladder flap area revealed it to be intact, clear urine was noted at that point.  Beginning, but tubes and ovaries were normal, Filshie clip was applied a right angle to both tubes 2-3 cm from the cornu with excellent application. Prior to closure sponge denies precast reported as correct x2 peritoneum was enclose a running  2-0 Vicryl suture. Rectus muscles reapproximated midline with 2-0 Vicryl running suture. 0 PDS from laterally to midline on either side to close the fascia. Subcutaneous tissue was hemostatic and fairly thin was not closed separately. 4-0 Monocryl subcuticular suture with an occlusive dressing applied she tolerated this well went to recovery room in good condition.  Dictated with [facility]  Richard M. Milana Obey.D.

## 2013-03-05 NOTE — Anesthesia Preprocedure Evaluation (Signed)
Anesthesia Evaluation  Patient identified by MRN, date of birth, ID band Patient awake    Reviewed: Allergy & Precautions, H&P , NPO status , Patient's Chart, lab work & pertinent test results  Airway Mallampati: II TM Distance: >3 FB Neck ROM: Full    Dental no notable dental hx. (+) Teeth Intact   Pulmonary neg pulmonary ROS,  breath sounds clear to auscultation  Pulmonary exam normal       Cardiovascular negative cardio ROS  Rhythm:Regular Rate:Normal     Neuro/Psych negative neurological ROS  negative psych ROS   GI/Hepatic negative GI ROS, Neg liver ROS,   Endo/Other  negative endocrine ROS  Renal/GU negative Renal ROS  negative genitourinary   Musculoskeletal   Abdominal Normal abdominal exam  (+)   Peds  Hematology negative hematology ROS (+)   Anesthesia Other Findings   Reproductive/Obstetrics (+) Pregnancy                           Anesthesia Physical Anesthesia Plan  ASA: II  Anesthesia Plan: Spinal   Post-op Pain Management:    Induction:   Airway Management Planned:   Additional Equipment:   Intra-op Plan:   Post-operative Plan:   Informed Consent: I have reviewed the patients History and Physical, chart, labs and discussed the procedure including the risks, benefits and alternatives for the proposed anesthesia with the patient or authorized representative who has indicated his/her understanding and acceptance.   Dental Advisory Given  Plan Discussed with: Anesthesiologist  Anesthesia Plan Comments:         Anesthesia Quick Evaluation

## 2013-03-05 NOTE — Transfer of Care (Signed)
Immediate Anesthesia Transfer of Care Note  Patient: [name]  Procedure(s) Performed: Procedure(s) with comments: CESAREAN SECTION WITH BILATERAL TUBAL LIGATION (Bilateral) - Primary edc 03/09/13  Patient Location: PACU  Anesthesia Type:Spinal  Level of Consciousness: awake, alert , oriented and patient cooperative  Airway & Oxygen Therapy: Patient Spontanous Breathing  Post-op Assessment: Report given to PACU RN and Post -op Vital signs reviewed and stable  Post vital signs: Reviewed and stable  Complications: No apparent anesthesia complications

## 2013-03-06 ENCOUNTER — Encounter (HOSPITAL_COMMUNITY): Payer: Self-pay | Admitting: Obstetrics and Gynecology

## 2013-03-06 LAB — CBC
HCT: 34.6 % — ABNORMAL LOW (ref 36.0–46.0)
Hemoglobin: 11.5 g/dL — ABNORMAL LOW (ref 12.0–15.0)
MCH: 29.1 pg (ref 26.0–34.0)
MCHC: 33.2 g/dL (ref 30.0–36.0)

## 2013-03-06 MED ORDER — OXYCODONE-ACETAMINOPHEN 5-325 MG PO TABS
1.0000 | ORAL_TABLET | ORAL | Status: DC | PRN
  Administered 2013-03-06 – 2013-03-07 (×5): 2 via ORAL
  Filled 2013-03-06 (×5): qty 2

## 2013-03-06 MED ORDER — HYDROCORTISONE 1 % EX OINT
TOPICAL_OINTMENT | Freq: Two times a day (BID) | CUTANEOUS | Status: DC
  Filled 2013-03-06: qty 28.35

## 2013-03-06 NOTE — Progress Notes (Signed)
Subjective: Postpartum Day 1: Cesarean Delivery Patient reports tolerating PO and no problems voiding.    Objective: Vital signs in last 24 hours: Temp:  [97.3 F (36.3 C)-98.8 F (37.1 C)] 97.5 F (36.4 C) (03/11 0537) Pulse Rate:  [64-92] 86 (03/11 0537) Resp:  [12-25] 20 (03/11 0537) BP: (87-122)/(42-75) 103/59 mmHg (03/11 0537) SpO2:  [97 %-100 %] 98 % (03/11 0130) Weight:  [152 lb (68.947 kg)] 152 lb (68.947 kg) (03/10 1141)  Physical Exam:  General: alert and cooperative Lochia: appropriate Uterine Fundus: firm Incision: honeycomb dressing noted CDI, slight erythema noted superior to dressing, related to latex allergy from previous abdominal dressing , slightly pruritic DVT Evaluation: No evidence of DVT seen on physical exam. No significant calf/ankle edema.   Recent Labs  03/06/13 0555  HGB 11.5*  HCT 34.6*    Assessment/Plan: Status post Cesarean section. Doing well postoperatively.  Continue current care Hydrocortisone to abd prn.  CURTIS,CAROL G 03/06/2013, 7:42 AM

## 2013-03-07 MED ORDER — PRENATAL 27-0.8 MG PO TABS: 1.0000 | ORAL_TABLET | Freq: Every day | ORAL | Status: DC

## 2013-03-07 MED ORDER — HYDROCORTISONE 1 % EX OINT: TOPICAL_OINTMENT | Freq: Two times a day (BID) | CUTANEOUS | Status: DC

## 2013-03-07 MED ORDER — OXYCODONE-ACETAMINOPHEN 5-325 MG PO TABS: 1.0000 | ORAL_TABLET | ORAL | Status: DC | PRN

## 2013-03-07 MED ORDER — IBUPROFEN 600 MG PO TABS: 600.0000 mg | ORAL_TABLET | Freq: Four times a day (QID) | ORAL | Status: DC | PRN

## 2013-03-07 NOTE — Discharge Summary (Signed)
Obstetric Discharge Summary Reason for Admission: cesarean section Prenatal Procedures: NST and ultrasound Intrapartum Procedures: cesarean: low cervical, transverse Postpartum Procedures: none Complications-Operative and Postpartum: none Hemoglobin  Date Value Range Status  03/06/2013 11.5* 12.0 - 15.0 g/dL Final     HCT  Date Value Range Status  03/06/2013 34.6* 36.0 - 46.0 % Final    Physical Exam:  General: alert and cooperative Lochia: appropriate Uterine Fundus: firm Incision: honeycomb dressing CDI DVT Evaluation: No evidence of DVT seen on physical exam. No significant calf/ankle edema.  Discharge Diagnoses: Term Pregnancy-delivered  Discharge Information: Date: 03/07/2013 Activity: pelvic rest Diet: routine Medications: PNV, Ibuprofen and Percocet Condition: stable Instructions: refer to practice specific booklet Discharge to: home   Newborn Data: Live born female  Birth Weight: 7 lb 8.3 oz (3410 g) APGAR: 9, 9  Home with mother.  CURTIS,CAROL G 03/07/2013, 8:13 AM

## 2013-03-07 NOTE — Progress Notes (Signed)
Pt discharged before CSW could assess history of depression/anxiety.   

## 2013-03-08 LAB — CORD BLOOD GAS (ARTERIAL): pH cord blood (arterial): 7.397

## 2013-03-14 ENCOUNTER — Telehealth (HOSPITAL_COMMUNITY): Payer: Self-pay | Admitting: *Deleted

## 2013-03-14 NOTE — Telephone Encounter (Signed)
Resolve episode 

## 2013-07-04 ENCOUNTER — Encounter (HOSPITAL_COMMUNITY): Payer: Self-pay | Admitting: *Deleted

## 2013-07-04 ENCOUNTER — Emergency Department (HOSPITAL_COMMUNITY)
Admission: EM | Admit: 2013-07-04 | Discharge: 2013-07-04 | Disposition: A | Discharge status: Home / Self Care | Payer: BC Managed Care – PPO | Source: Home / Self Care | Attending: Family Medicine | Admitting: Family Medicine

## 2013-07-04 DIAGNOSIS — J020 Streptococcal pharyngitis: Secondary | ICD-10-CM

## 2013-07-04 LAB — POCT RAPID STREP A: Streptococcus, Group A Screen (Direct): POSITIVE — AB

## 2013-07-04 MED ORDER — AMOXICILLIN 875 MG PO TABS: 875.0000 mg | ORAL_TABLET | Freq: Two times a day (BID) | ORAL | Status: DC

## 2013-07-04 MED ORDER — METHYLPREDNISOLONE 4 MG PO KIT: PACK | ORAL | Status: DC

## 2013-07-04 NOTE — ED Notes (Signed)
Pt  Reports   Symptoms  Of   sorethroat       And  Nasal  Congestion           And  Pain  When  She  Swallows      Pt  Is  Sitting  Upright on  Exam table  Speaking in  Complete  sentances

## 2013-07-04 NOTE — ED Provider Notes (Signed)
History    CSN: [id number] Arrival date & time 07/04/13  0901  First MD Initiated Contact with Patient 07/04/13 [phone]     Chief Complaint  Patient presents with  . Sore Throat   (Consider location/radiation/quality/duration/timing/severity/associated sxs/prior Treatment) HPI Comments: 32 year old female presents complaining of progressively worsening sore throat that began on Monday 2 days ago. She is also now developing some soreness in her anterior neck, subjective fever and chills, as well as nasal congestion. She has been taking Tylenol Cold and flu which she says has helped with her fever but has not helped the sore throat very much. She denies any nausea, vomiting, diarrhea, cough, rash, chest pain, palpitations, or dark urine.  Patient is a 32 y.o. female presenting with pharyngitis.  Sore Throat Pertinent negatives include no chest pain, no abdominal pain and no shortness of breath.   Past Medical History  Diagnosis Date  . Medical history non-contributory   . SVD (spontaneous vaginal delivery)     x 1   Past Surgical History  Procedure Laterality Date  . Dilation and curettage of uterus    . Wisdom tooth extraction    . Cesarean section with bilateral tubal ligation Bilateral 03/05/2013    Procedure: CESAREAN SECTION WITH BILATERAL TUBAL LIGATION;  Surgeon: Meriel Pica, MD;  Location: [facility];  Service: Obstetrics;  Laterality: Bilateral;  Primary edc 03/09/13   History reviewed. No pertinent family history. History  Substance Use Topics  . Smoking status: Never Smoker   . Smokeless tobacco: Never Used  . Alcohol Use: No   OB History   Grav Para Term Preterm Abortions TAB SAB Ect Mult Living   3 2 2  0 1 0 1 0 0 2     Review of Systems  Constitutional: Positive for fever and chills. Negative for diaphoresis.  HENT: Positive for congestion, sore throat and neck pain (she thinks her lymph nodes are swollen). Negative for ear pain, rhinorrhea, trouble swallowing,  postnasal drip, sinus pressure and ear discharge.   Eyes: Negative for visual disturbance.  Respiratory: Negative for cough and shortness of breath.   Cardiovascular: Negative for chest pain, palpitations and leg swelling.  Gastrointestinal: Negative for nausea, vomiting and abdominal pain.  Endocrine: Negative for polydipsia and polyuria.  Genitourinary: Negative for dysuria, urgency and frequency.  Musculoskeletal: Negative for myalgias and arthralgias.  Skin: Negative for rash.  Neurological: Negative for dizziness, weakness and light-headedness.    Allergies  Latex  Home Medications   Current Outpatient Rx  Name  Route  Sig  Dispense  Refill  . amoxicillin (AMOXIL) 875 MG tablet   Oral   Take 1 tablet (875 mg total) by mouth 2 (two) times daily.   14 tablet   0   . hydrocortisone 1 % ointment   Topical   Apply topically 2 (two) times daily.   30 g   0   . ibuprofen (ADVIL,MOTRIN) 600 MG tablet   Oral   Take 1 tablet (600 mg total) by mouth every 6 (six) hours as needed.   30 tablet   1   . methylPREDNISolone (MEDROL DOSEPAK) 4 MG tablet      Use as directed   21 tablet   0   . oxyCODONE-acetaminophen (PERCOCET/ROXICET) 5-325 MG per tablet   Oral   Take 1-2 tablets by mouth every 4 (four) hours as needed.   30 tablet   0   . Prenatal Vit-Fe Fumarate-FA (MULTIVITAMIN-PRENATAL) 27-0.8 MG TABS   Oral  Take 1 tablet by mouth daily.   30 each   4    BP 128/70  Pulse 72  Temp(Src) 98.6 F (37 C) (Oral)  Resp 18  SpO2 98%  LMP 06/18/2013 Physical Exam  Nursing note and vitals reviewed. Constitutional: She is oriented to person, place, and time. Vital signs are normal. She appears well-developed and well-nourished. No distress.  HENT:  Head: Normocephalic and atraumatic.  Right Ear: External ear normal.  Left Ear: External ear normal.  Nose: Nose normal.  Mouth/Throat: Oropharyngeal exudate (with erythema and mild swelling) present.  Eyes: EOM are  normal. Pupils are equal, round, and reactive to light.  Cardiovascular: Normal rate, regular rhythm and normal heart sounds.  Exam reveals no gallop and no friction rub.   No murmur heard. Pulmonary/Chest: Effort normal and breath sounds normal. No respiratory distress. She has no wheezes. She has no rales.  Abdominal: Soft. There is no tenderness.  Lymphadenopathy:       Head (right side): Tonsillar adenopathy present. No submandibular, no preauricular and no posterior auricular adenopathy present.       Head (left side): Tonsillar adenopathy present. No submandibular, no preauricular and no posterior auricular adenopathy present.    She has no cervical adenopathy.  Neurological: She is alert and oriented to person, place, and time. She has normal strength.  Skin: Skin is warm and dry. She is not diaphoretic.  Psychiatric: She has a normal mood and affect. Her behavior is normal. Judgment normal.    ED Course  Procedures (including critical care time) Labs Reviewed  POCT RAPID STREP A ([facility] ONLY) - Abnormal; Notable for the following:    Streptococcus, Group A Screen (Direct) POSITIVE (*)    All other components within normal limits   No results found. 1. Strep pharyngitis     MDM  Rapid strep is positive. Treat with amoxicillin, Medrol Dosepak, ibuprofen when necessary, gargles saltwater, Chloraseptic spray or sore throat lozenges when necessary. Follow up in 4 days if not improving   Meds ordered this encounter  Medications  . amoxicillin (AMOXIL) 875 MG tablet    Sig: Take 1 tablet (875 mg total) by mouth 2 (two) times daily.    Dispense:  14 tablet    Refill:  0  . methylPREDNISolone (MEDROL DOSEPAK) 4 MG tablet    Sig: Use as directed    Dispense:  21 tablet    Refill:  0     Graylon Good, PA-C 07/04/13 [phone]

## 2013-07-06 NOTE — ED Provider Notes (Signed)
Medical screening examination/treatment/procedure(s) were performed by non-physician practitioner and as supervising physician I was immediately available for consultation/collaboration.   MORENO-COLL,Jennavieve Arrick; MD  Maceo Hernan Moreno-Coll, MD 07/06/13 0755 

## 2013-09-16 IMAGING — US US OB FOLLOW-UP
1 series · 12 of 28 positions shown · non-contrast
Comparison: none

[Series 1: us ob follow-up · 0.23mm/px · 12 of 43 slices shown]
[im 2/43]
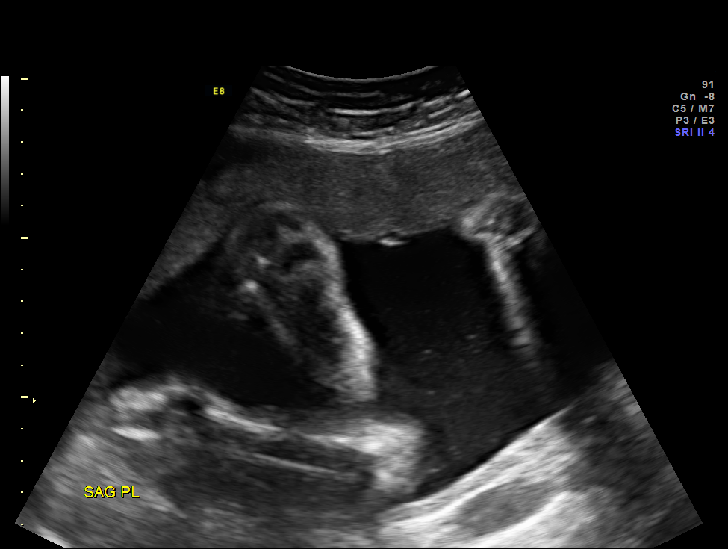
[im 5/43]
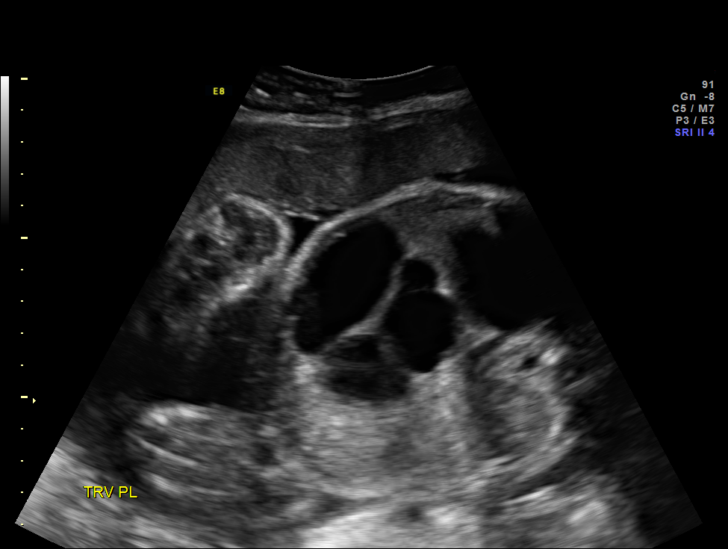
[im 8/43]
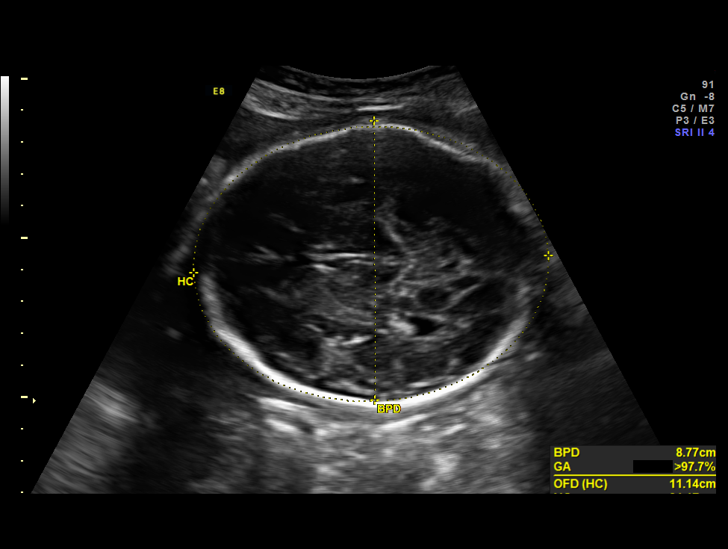
[im 13/43]
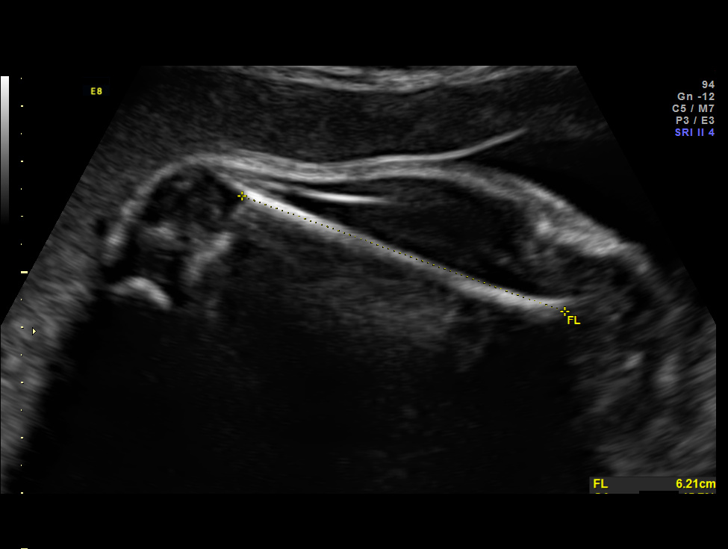
[im 16/43]
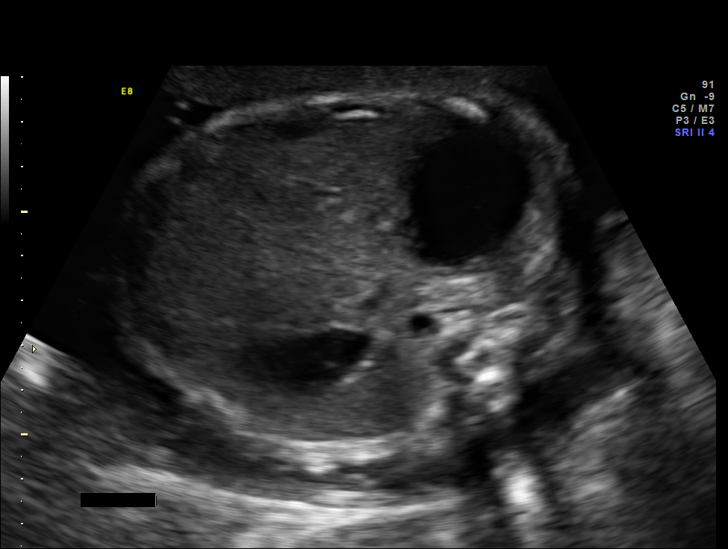
[im 19/43]
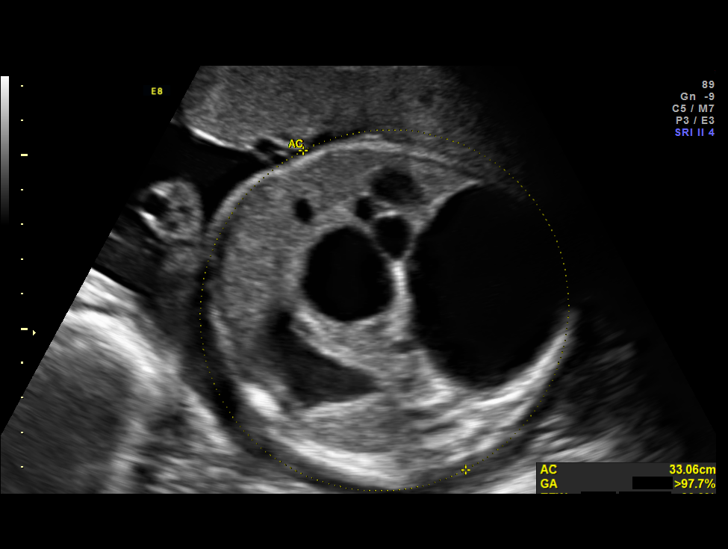
[im 24/43]
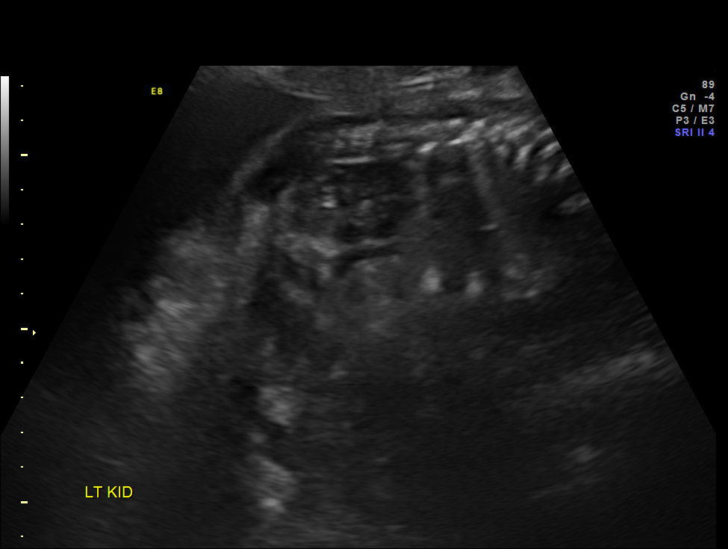
[im 27/43]
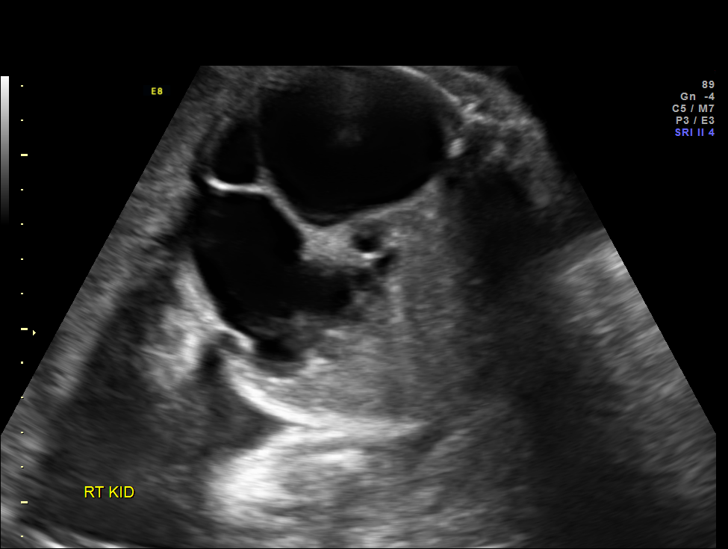
[im 30/43]
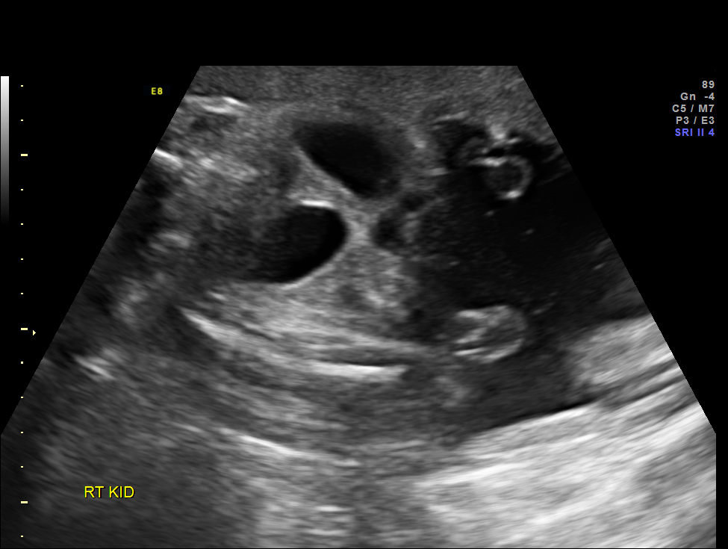
[im 35/43]
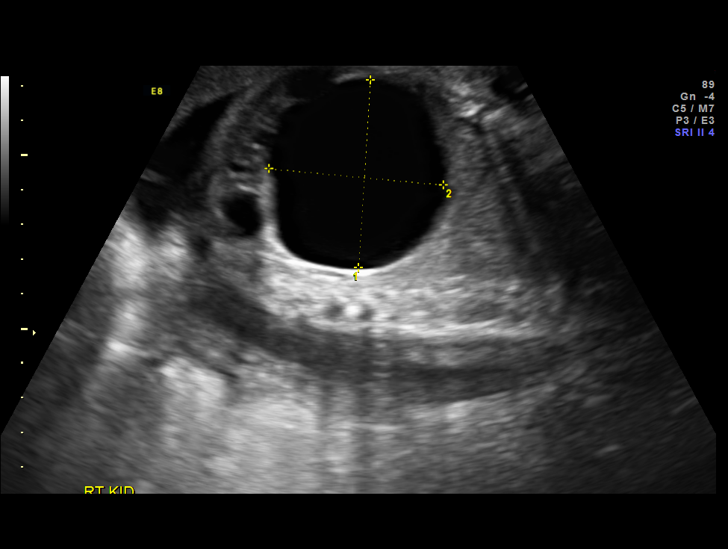
[im 38/43]
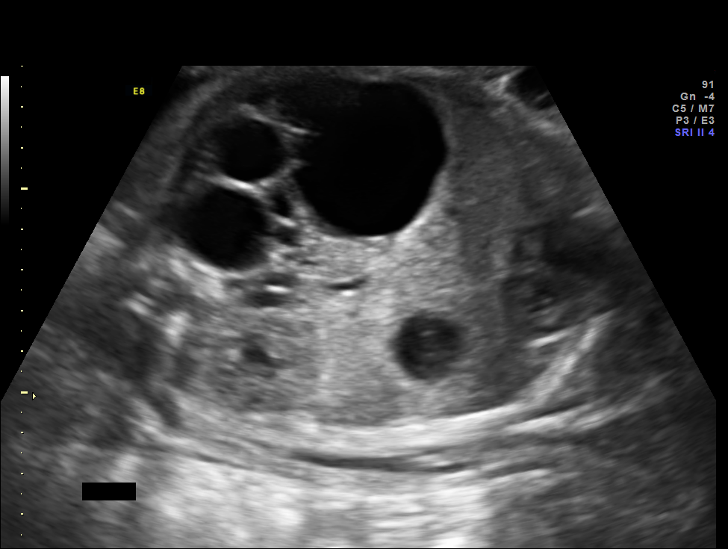
[im 41/43]
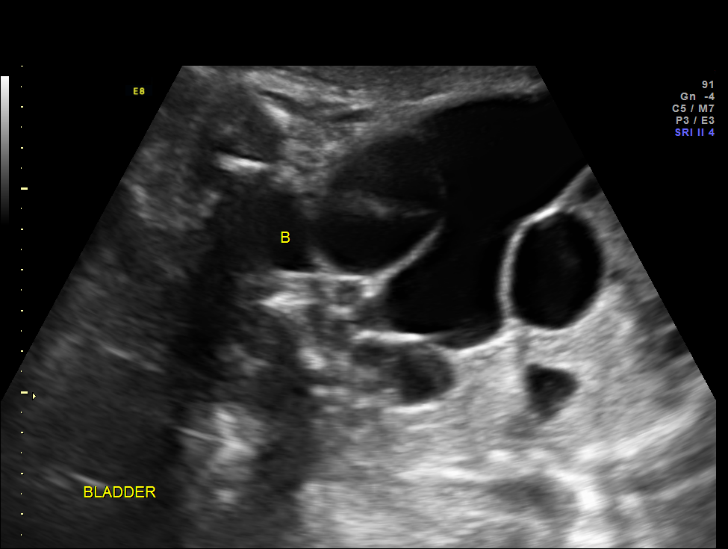

[12 of 28 positions shown; findings below may reference images not displayed]

OBSTETRICS REPORT
                      (Signed Final 01/11/2013 [DATE])

Service(s) Provided

 US OB FOLLOW UP                                       76816.1
Indications

 Right Multicystic dysplastic kidney
Fetal Evaluation

 Num Of Fetuses:    1
 Fetal Heart Rate:  152                          bpm
 Cardiac Activity:  Observed
 Presentation:      Cephalic
 Placenta:          Anterior, above cervical os
 P. Cord            Previously Visualized
 Insertion:

 Amniotic Fluid
 AFI FV:      Subjectively within normal limits
                                             Larg Pckt:     7.7  cm
Biometry

 BPD:     87.3  mm     G. Age:  35w 2d                CI:         77.2   70 - 86
 OFD:    113.1  mm                                    FL/HC:      19.5   19.1 -

 HC:     314.8  mm     G. Age:  35w 2d       93  %    HC/AC:      0.96   0.96 -

 AC:     326.7  mm     G. Age:  36w 4d     > 97  %    FL/BPD:     70.4   71 - 87
 FL:      61.5  mm     G. Age:  31w 6d       39  %    FL/AC:      18.8   20 - 24
 HUM:     55.1  mm     G. Age:  32w 0d       54  %

 Est. FW:    2866  gm    5 lb 12 oz    > 90  %
Gestational Age

 LMP:           31w 6d        Date:  06/02/12                 EDD:   03/09/13
 U/S Today:     34w 5d                                        EDD:   02/17/13
 Best:          31w 6d     Det. By:  LMP  (06/02/12)          EDD:   03/09/13
Anatomy

 Cranium:          Previously seen        Aortic Arch:      Previously seen
 Fetal Cavum:      Previously seen        Ductal Arch:      Previously seen
 Ventricles:       Appears normal         Diaphragm:        Appears normal
 Choroid Plexus:   Previously seen        Stomach:          Appears normal, left
                                                            sided
 Cerebellum:       Previously seen        Abdomen:          Previously seen
 Posterior Fossa:  Previously seen        Abdominal Wall:   Previously seen
 Nuchal Fold:      Previously seen        Cord Vessels:     Previously seen
 Face:             Orbits and profile     Kidneys:          Right Multicystic
                   previously seen
                                                            dysplastic
 Lips:             Previously seen        Bladder:          Previously seen
 Heart:            Appears normal         Spine:            Previously seen
                   (4CH, axis, and
                   situs)
 RVOT:             Previously seen        Lower             Previously seen
                                          Extremities:
 LVOT:             Previously seen        Upper             Previously seen
                                          Extremities:

 Other:  Fetus appears to be a male. Heels and 5th digit previously seen.
Cervix Uterus Adnexa

 Cervix:       Not visualized (advanced GA >46wks)
Impression

 Single IUP at 31 [DATE] weeks
 Large right multicystic dysplastic kidney; normal left kidney
 and bladder
 The estimated fetal weight today is >90th %tile, but mostly
 secondary to the enlarged AC (> 97th %tile) due to the
 enlarged right kidney
 The remainder of the fetal anatomy is normal
 Normal amniotic fluid volume
Recommendations

 Recommend follow-up ultrasound examination in 2 weeks
 amniotic fluid assessment and 4 weeks for interval growth.
 Based on enlarged AC, will likely require cesarean delivery -
 will reassess at next growth scan
 Patient was seen by Peds Urology - plans delivery at
 [HOSPITAL] with neonatal follow up.

 questions or concerns.

## 2014-10-08 ENCOUNTER — Ambulatory Visit (INDEPENDENT_AMBULATORY_CARE_PROVIDER_SITE_OTHER): Payer: BC Managed Care – PPO | Admitting: Neurology

## 2014-10-08 ENCOUNTER — Encounter: Payer: Self-pay | Admitting: Neurology

## 2014-10-08 ENCOUNTER — Encounter (INDEPENDENT_AMBULATORY_CARE_PROVIDER_SITE_OTHER): Payer: Self-pay

## 2014-10-08 VITALS — BP 106/83 | HR 83 | Ht 63.0 in | Wt 113.6 lb

## 2014-10-08 DIAGNOSIS — G43C1 Periodic headache syndromes in child or adult, intractable: Secondary | ICD-10-CM

## 2014-10-08 DIAGNOSIS — G43909 Migraine, unspecified, not intractable, without status migrainosus: Secondary | ICD-10-CM

## 2014-10-08 HISTORY — DX: Migraine, unspecified, not intractable, without status migrainosus: G43.909

## 2014-10-08 MED ORDER — SUMATRIPTAN SUCCINATE 6 MG/0.5ML ~~LOC~~ SOAJ: 6.0000 mg | Freq: Two times a day (BID) | SUBCUTANEOUS | Status: DC | PRN

## 2014-10-08 MED ORDER — RIZATRIPTAN BENZOATE 10 MG PO TABS: 10.0000 mg | ORAL_TABLET | ORAL | Status: DC | PRN

## 2014-10-08 NOTE — Progress Notes (Signed)
Reason for visit: Migraine headache  [name] is a 33 y.o. female  History of present illness:  [name] is a 33 year old right-handed white female with a history of migraine headaches dating back approximately 25 years. The patient indicates that most of her headaches are left-sided beginning in the left frontotemporal region, but occasionally the headaches will occur on the right side. The headaches are throbbing in nature, and may be associated with nausea and diarrhea, she will rarely vomit. The patient has photophobia and phonophobia with the headache. She has had a recent increase in the frequency of her headaches, and she is having 5 or 6 headache days a month. When she gets a headache, she may have headache discomfort lasting 2-3 days. The patient is missing work and family activities because of the headache. She has been on Topamax over the last 18 months, and the dose was recently increased from 150 mg daily to a 200 mg daily dose. She takes low-dose Maxalt which is effective. In the past, she indicates that she was on Imitrex including the injectable without benefit, but this was greater than 15 years ago. The patient has in the past had trigger point injections without benefit, and she has been on beta blockers, possibly she has been on nortriptyline and Depakote in the past without benefit. She does not believe that she has been on calcium channel blockers previously. The patient comes to this office for an evaluation. She denies any allergy symptoms or sinus drainage. Lack of sleep, and odors such as perfumes may bring on headaches.  Past Medical History  Diagnosis Date  . Medical history non-contributory   . SVD (spontaneous vaginal delivery)     x 1  . Headache   . Depression   . Anxiety   . Migraine 10/08/2014    Past Surgical History  Procedure Laterality Date  . Dilation and curettage of uterus    . Wisdom tooth extraction    . Cesarean section with bilateral  tubal ligation Bilateral 03/05/2013    Procedure: CESAREAN SECTION WITH BILATERAL TUBAL LIGATION;  Surgeon: Meriel Picaichard M Holland, MD;  Location: [facility];  Service: Obstetrics;  Laterality: Bilateral;  Primary edc 03/09/13    Family History  Problem Relation Age of Onset  . Diabetes Mellitus I Mother   . Cancer - Other Mother     breast  . Bipolar disorder Brother   . Melanoma Paternal Grandfather   . Cancer - Other Paternal Grandfather     throat  . Migraines Maternal Grandmother     Social history:  reports that she has never smoked. She has never used smokeless tobacco. She reports that she drinks alcohol. She reports that she does not use illicit drugs.  Medications:  Current Outpatient Prescriptions on File Prior to Visit  Medication Sig Dispense Refill  . ibuprofen (ADVIL,MOTRIN) 600 MG tablet Take 1 tablet (600 mg total) by mouth every 6 (six) hours as needed.  30 tablet  1   No current facility-administered medications on file prior to visit.      Allergies  Allergen Reactions  . Latex Rash    ROS:  Out of a complete 14 system review of symptoms, the patient complains only of the following symptoms, and all other reviewed systems are negative.  Fatigue Headache, anxiety  Blood pressure 106/83, pulse 83, height 5\' 3"  (1.6 m), weight 113 lb 9.6 oz (51.529 kg).  Physical Exam  General: The patient is alert and cooperative  at the time of the examination.  Eyes: Pupils are equal, round, and reactive to light. Discs are flat bilaterally.  Neck: The neck is supple, no carotid bruits are noted.  Respiratory: The respiratory examination is clear.  Cardiovascular: The cardiovascular examination reveals a regular rate and rhythm, no obvious murmurs or rubs are noted.  Neuromuscular: Range of movement of the cervical spine is full, no crepitus is noted in the temporomandibular joints.  Skin: Extremities are without significant edema.  Neurologic Exam  Mental status:  The patient is alert and oriented x 3 at the time of the examination. The patient has apparent normal recent and remote memory, with an apparently normal attention span and concentration ability.  Cranial nerves: Facial symmetry is present. There is good sensation of the face to pinprick and soft touch bilaterally. The strength of the facial muscles and the muscles to head turning and shoulder shrug are normal bilaterally. Speech is well enunciated, no aphasia or dysarthria is noted. Extraocular movements are full. Visual fields are full. The tongue is midline, and the patient has symmetric elevation of the soft palate. No obvious hearing deficits are noted.  Motor: The motor testing reveals 5 over 5 strength of all 4 extremities. Good symmetric motor tone is noted throughout.  Sensory: Sensory testing is intact to pinprick, soft touch, vibration sensation, and position sense on all 4 extremities. No evidence of extinction is noted.  Coordination: Cerebellar testing reveals good finger-nose-finger and heel-to-shin bilaterally.  Gait and station: Gait is normal. Tandem gait is normal. Romberg is negative. No drift is seen.  Reflexes: Deep tendon reflexes are symmetric and normal bilaterally. Toes are downgoing bilaterally.   Assessment/Plan:  One. Migraine headache  The patient will continue the Topamax at 200 mg night as this was just increased. If this is not effective in 3-4 weeks, we may consider the addition of a calcium channel blocker. The patient will be given the 10 mg Maxalt tablets, and she will be given Imitrex injectable to take if she wakes up with the headache. The patient notes that the Maxalt is not effective if she wakes up with a severe headache. The patient is not to take the Maxalt and Imitrex together. She will go on magnesium supplementation and riboflavin. She will followup in 3 months. She will contact our office if she is not doing well.   Marlan Palau. Keith [name] MD 10/08/2014  8:14 PM  [facility] [address] [place], [place] [id number]  Phone [phone] Fax [phone]

## 2014-10-08 NOTE — Patient Instructions (Addendum)
  Begin magnesium oxide 250 mg daily and riboflavin 400 mg a day.   Migraine Headache A migraine headache is an intense, throbbing pain on one or both sides of your head. A migraine can last for 30 minutes to several hours. CAUSES  The exact cause of a migraine headache is not always known. However, a migraine may be caused when nerves in the brain become irritated and release chemicals that cause inflammation. This causes pain. Certain things may also trigger migraines, such as:  Alcohol.  Smoking.  Stress.  Menstruation.  Aged cheeses.  Foods or drinks that contain nitrates, glutamate, aspartame, or tyramine.  Lack of sleep.  Chocolate.  Caffeine.  Hunger.  Physical exertion.  Fatigue.  Medicines used to treat chest pain (nitroglycerine), birth control pills, estrogen, and some blood pressure medicines. SIGNS AND SYMPTOMS  Pain on one or both sides of your head.  Pulsating or throbbing pain.  Severe pain that prevents daily activities.  Pain that is aggravated by any physical activity.  Nausea, vomiting, or both.  Dizziness.  Pain with exposure to bright lights, loud noises, or activity.  General sensitivity to bright lights, loud noises, or smells. Before you get a migraine, you may get warning signs that a migraine is coming (aura). An aura may include:  Seeing flashing lights.  Seeing bright spots, halos, or zigzag lines.  Having tunnel vision or blurred vision.  Having feelings of numbness or tingling.  Having trouble talking.  Having muscle weakness. DIAGNOSIS  A migraine headache is often diagnosed based on:  Symptoms.  Physical exam.  A CT scan or MRI of your head. These imaging tests cannot diagnose migraines, but they can help rule out other causes of headaches. TREATMENT Medicines may be given for pain and nausea. Medicines can also be given to help prevent recurrent migraines.  HOME CARE INSTRUCTIONS  Only take over-the-counter  or prescription medicines for pain or discomfort as directed by your health care provider. The use of long-term narcotics is not recommended.  Lie down in a dark, quiet room when you have a migraine.  Keep a journal to find out what may trigger your migraine headaches. For example, write down:  What you eat and drink.  How much sleep you get.  Any change to your diet or medicines.  Limit alcohol consumption.  Quit smoking if you smoke.  Get 7-9 hours of sleep, or as recommended by your health care provider.  Limit stress.  Keep lights dim if bright lights bother you and make your migraines worse. SEEK IMMEDIATE MEDICAL CARE IF:   Your migraine becomes severe.  You have a fever.  You have a stiff neck.  You have vision loss.  You have muscular weakness or loss of muscle control.  You start losing your balance or have trouble walking.  You feel faint or pass out.  You have severe symptoms that are different from your first symptoms. MAKE SURE YOU:   Understand these instructions.  Will watch your condition.  Will get help right away if you are not doing well or get worse. Document Released: 12/13/2005 Document Revised: 04/29/2014 Document Reviewed: 08/20/2013 [facility] Patient Information 2015 [employer] [place]. This information is not intended to replace advice given to you by your health care provider. Make sure you discuss any questions you have with your health care provider.

## 2014-10-28 ENCOUNTER — Encounter: Payer: Self-pay | Admitting: Neurology

## 2014-10-31 ENCOUNTER — Telehealth: Payer: Self-pay | Admitting: Neurology

## 2014-10-31 NOTE — Telephone Encounter (Signed)
Patient is calling because medication Topomax 200mg  is making patient very nauseated all the time and not helping the frequency of patient's headaches. Does patient need an appointment? Please call.

## 2014-10-31 NOTE — Telephone Encounter (Signed)
I called the patient, left a message. The patient may have to cut back on Topamax taking 150 mg daily.  We may give a trial on a calcium channel blocker. I will try to call the patient back later.

## 2014-11-01 MED ORDER — VERAPAMIL HCL ER 120 MG PO TBCR: 120.0000 mg | EXTENDED_RELEASE_TABLET | Freq: Every day | ORAL | Status: DC

## 2014-11-01 MED ORDER — TOPIRAMATE 50 MG PO TABS: 150.0000 mg | ORAL_TABLET | Freq: Every day | ORAL | Status: DC

## 2014-11-01 NOTE — Telephone Encounter (Signed)
I called patient. We will cut back on the Topamax taking 150 mg daily, add verapamil 120 mg SR tablet daily. The patient indicates that her headaches have actually worsened on the Topamax, she is having a lot of nausea, decreased appetite.

## 2015-01-14 ENCOUNTER — Encounter: Payer: Self-pay | Admitting: Neurology

## 2015-01-14 ENCOUNTER — Ambulatory Visit (INDEPENDENT_AMBULATORY_CARE_PROVIDER_SITE_OTHER): Payer: BLUE CROSS/BLUE SHIELD | Admitting: Neurology

## 2015-01-14 VITALS — BP 93/60 | HR 101 | Ht 63.0 in | Wt 115.0 lb

## 2015-01-14 DIAGNOSIS — G43C1 Periodic headache syndromes in child or adult, intractable: Secondary | ICD-10-CM

## 2015-01-14 NOTE — Patient Instructions (Signed)

## 2015-01-14 NOTE — Progress Notes (Signed)
Reason for visit: Migraine headache  [name] is an 34 y.o. female  History of present illness:  [name] is a 34 year old right-handed white female with a history of migraine headaches. The patient was having 6-8 headaches a month, and missing several days of work each month. The patient was not getting benefit on Topamax at the 200 mg dosing, and she felt that the medication was worsening her headache. The patient has been placed on verapamil taking 120 mg SR tablet daily, with good improvement of her headache. The patient has had only one headache a month on average since being on this medication. She has dropped back on the Topamax taking 150 mg at night, but she is reporting some problems with cognitive processing. She indicates that her headaches will last only about an hour when she takes her rescue medication. She is pleased with her current headache control. She is having some dizziness when she goes from lying to standing while verapamil.  Past Medical History  Diagnosis Date  . Medical history non-contributory   . SVD (spontaneous vaginal delivery)     x 1  . Headache   . Depression   . Anxiety   . Migraine 10/08/2014    Past Surgical History  Procedure Laterality Date  . Dilation and curettage of uterus    . Wisdom tooth extraction    . Cesarean section with bilateral tubal ligation Bilateral 03/05/2013    Procedure: CESAREAN SECTION WITH BILATERAL TUBAL LIGATION;  Surgeon: Meriel Picaichard M Holland, MD;  Location: [facility];  Service: Obstetrics;  Laterality: Bilateral;  Primary edc 03/09/13    Family History  Problem Relation Age of Onset  . Diabetes Mellitus I Mother   . Cancer - Other Mother     breast  . Bipolar disorder Brother   . Melanoma Paternal Grandfather   . Cancer - Other Paternal Grandfather     throat  . Migraines Maternal Grandmother     Social history:  reports that she has never smoked. She has never used smokeless tobacco. She reports that she  drinks alcohol. She reports that she does not use illicit drugs.    Allergies  Allergen Reactions  . Latex Rash    Medications:  Current Outpatient Prescriptions on File Prior to Visit  Medication Sig Dispense Refill  . ibuprofen (ADVIL,MOTRIN) 600 MG tablet Take 1 tablet (600 mg total) by mouth every 6 (six) hours as needed. 30 tablet 1  . rizatriptan (MAXALT) 10 MG tablet Take 1 tablet (10 mg total) by mouth as needed for migraine. May repeat in 2 hours if needed 12 tablet 5  . SUMAtriptan 6 MG/0.5ML SOAJ Inject 6 mg into the skin 2 (two) times daily as needed. Do not use with maxalt 4 cartridge 3  . topiramate (TOPAMAX) 50 MG tablet Take 3 tablets (150 mg total) by mouth daily. 90 tablet 3  . verapamil (CALAN SR) 120 MG CR tablet Take 1 tablet (120 mg total) by mouth at bedtime. 30 tablet 3   No current facility-administered medications on file prior to visit.    ROS:  Out of a complete 14 system review of symptoms, the patient complains only of the following symptoms, and all other reviewed systems are negative.  Headache Decreased concentration, anxiety  Blood pressure 93/60, pulse 101, height 5\' 3"  (1.6 m), weight 115 lb (52.164 kg).  Physical Exam  General: The patient is alert and cooperative at the time of the examination.  Skin: No  significant peripheral edema is noted.   Neurologic Exam  Mental status: The patient is oriented x 3.  Cranial nerves: Facial symmetry is present. Speech is normal, no aphasia or dysarthria is noted. Extraocular movements are full. Visual fields are full.  Motor: The patient has good strength in all 4 extremities.  Sensory examination: Soft touch sensation is symmetric on the face, arms, and legs.  Coordination: The patient has good finger-nose-finger and heel-to-shin bilaterally.  Gait and station: The patient has a normal gait. Tandem gait is normal. Romberg is negative. No drift is seen.  Reflexes: Deep tendon reflexes are  symmetric.   Assessment/Plan:  1. Migraine headache  The patient is doing much better on verapamil. The patient will continue on her current dose, she will taper off of the Topamax by going down to 100 mg at night for 2 weeks, then going to 50 mg night for 2 weeks, then stop the medication. She will follow-up in 6 months. She will contact our office if the headaches are worsening. Stress seems to be a big activator for her. She has on off of her Zoloft, but she feels that she needs the medication again.  Marlan Palau MD 01/14/2015 8:55 AM  [facility] [address] [place], [place] [address]  Phone [phone] Fax [phone]

## 2015-05-14 ENCOUNTER — Other Ambulatory Visit: Payer: Self-pay

## 2015-05-14 MED ORDER — VERAPAMIL HCL ER 120 MG PO TBCR: 120.0000 mg | EXTENDED_RELEASE_TABLET | Freq: Every day | ORAL | Status: DC

## 2015-05-15 ENCOUNTER — Other Ambulatory Visit: Payer: Self-pay | Admitting: Obstetrics and Gynecology

## 2015-05-19 LAB — CYTOLOGY - PAP

## 2015-07-11 ENCOUNTER — Ambulatory Visit (INDEPENDENT_AMBULATORY_CARE_PROVIDER_SITE_OTHER): Payer: BC Managed Care – PPO | Admitting: Nurse Practitioner

## 2015-07-11 ENCOUNTER — Encounter: Payer: Self-pay | Admitting: Nurse Practitioner

## 2015-07-11 VITALS — BP 97/63 | HR 87 | Ht 64.0 in | Wt 115.5 lb

## 2015-07-11 DIAGNOSIS — G43C0 Periodic headache syndromes in child or adult, not intractable: Secondary | ICD-10-CM

## 2015-07-11 MED ORDER — RIZATRIPTAN BENZOATE 10 MG PO TABS: 10.0000 mg | ORAL_TABLET | ORAL | Status: DC | PRN

## 2015-07-11 MED ORDER — VERAPAMIL HCL ER 120 MG PO TBCR: 120.0000 mg | EXTENDED_RELEASE_TABLET | Freq: Every day | ORAL | Status: DC

## 2015-07-11 MED ORDER — TOPIRAMATE 50 MG PO TABS: 100.0000 mg | ORAL_TABLET | Freq: Every day | ORAL | Status: DC

## 2015-07-11 NOTE — Progress Notes (Signed)
I have read the note, and I agree with the clinical assessment and plan.  [name],[name]   

## 2015-07-11 NOTE — Progress Notes (Signed)
[facility]  PATIENT: [name] DOB: 11/14/1981   REASON FOR VISIT: follow up for migraine HISTORY FROM: Patient    HISTORY OF PRESENT ILLNESS:[name] is a 34 year old right-handed white female with a history of migraine headaches.  The patient has been placed on verapamil taking 120 mg SR tablet daily, with good improvement of her headache. The patient has had only one headache a month on average since being on this medication. Her migraine triggers are her menstrual cycle and lack of sleep. She has dropped back on the Topamax taking 100 mg at night. This combination seems to be working well for her. She  indicates that her headaches will last only about an hour when she takes her rescue medication. She is pleased with her current headache control. She returns for reevaluation and refills REVIEW OF SYSTEMS: Full 14 system review of systems performed and notable only for those listed, all others are neg:  Constitutional: neg  Cardiovascular: neg Ear/Nose/Throat: neg  Skin: neg Eyes: neg Respiratory: neg Gastroitestinal: neg  Hematology/Lymphatic: neg  Endocrine: neg Musculoskeletal:neg Allergy/Immunology: neg Neurological: neg Psychiatric: neg Sleep : neg   ALLERGIES: Allergies  Allergen Reactions  . Latex Rash    HOME MEDICATIONS: Outpatient Prescriptions Prior to Visit  Medication Sig Dispense Refill  . ibuprofen (ADVIL,MOTRIN) 600 MG tablet Take 1 tablet (600 mg total) by mouth every 6 (six) hours as needed. 30 tablet 1  . rizatriptan (MAXALT) 10 MG tablet Take 1 tablet (10 mg total) by mouth as needed for migraine. May repeat in 2 hours if needed 12 tablet 5  . SUMAtriptan 6 MG/0.5ML SOAJ Inject 6 mg into the skin 2 (two) times daily as needed. Do not use with maxalt 4 cartridge 3  . topiramate (TOPAMAX) 50 MG tablet Take 3 tablets (150 mg total) by mouth daily. (Patient taking differently: Take 100 mg by mouth daily. ) 90 tablet 3  .  verapamil (CALAN SR) 120 MG CR tablet Take 1 tablet (120 mg total) by mouth at bedtime. 30 tablet 3   No facility-administered medications prior to visit.    PAST MEDICAL HISTORY: Past Medical History  Diagnosis Date  . Medical history non-contributory   . SVD (spontaneous vaginal delivery)     x 1  . Headache   . Depression   . Anxiety   . Migraine 10/08/2014    PAST SURGICAL HISTORY: Past Surgical History  Procedure Laterality Date  . Dilation and curettage of uterus    . Wisdom tooth extraction    . Cesarean section with bilateral tubal ligation Bilateral 03/05/2013    Procedure: CESAREAN SECTION WITH BILATERAL TUBAL LIGATION;  Surgeon: Meriel Picaichard M Holland, MD;  Location: [facility];  Service: Obstetrics;  Laterality: Bilateral;  Primary edc 03/09/13    FAMILY HISTORY: Family History  Problem Relation Age of Onset  . Diabetes Mellitus I Mother   . Cancer - Other Mother     breast  . Bipolar disorder Brother   . Melanoma Paternal Grandfather   . Cancer - Other Paternal Grandfather     throat  . Migraines Maternal Grandmother     SOCIAL HISTORY: History   Social History  . Marital Status: Married    Spouse Name: N/A  . Number of Children: 2  . Years of Education: N/A   Occupational History  .  [employer]   Social History Main Topics  . Smoking status: Never Smoker   . Smokeless tobacco: Never Used  .  Alcohol Use: Yes     Comment: one drink weekly  . Drug Use: No  . Sexual Activity: Yes    Birth Control/ Protection: None     Comment: pregnant   Other Topics Concern  . Not on file   Social History Narrative   Patient is right handed   Patient drinks about 2 cups caffeine daily.     PHYSICAL EXAM  Filed Vitals:   07/11/15 0954  BP: 97/63  Pulse: 87  Height:  (1.626 m)  Weight: 115 lb 8 oz (52.39 kg)   Body mass index is 19.82 kg/(m^2).  Generalized: Well developed, in no acute distress  Head: normocephalic and atraumatic,.  Oropharynx benign  Neck: Supple, no carotid bruits  Musculoskeletal: No deformity   Neurological examination   Mentation: Alert oriented to time, place, history taking. Attention span and concentration appropriate. Recent and remote memory intact.  Follows all commands speech and language fluent.   Cranial nerve II-XII: Pupils were equal round reactive to light extraocular movements were full, visual field were full on confrontational test. Facial sensation and strength were normal. hearing was intact to finger rubbing bilaterally. Uvula tongue midline. head turning and shoulder shrug were normal and symmetric.Tongue protrusion into cheek strength was normal. Motor: normal bulk and tone, full strength in the BUE, BLE,  Sensory: normal and symmetric to light touch, pinprick, and  Vibration,  Coordination: finger-nose-finger, heel-to-shin bilaterally, no dysmetria Reflexes: Brachioradialis 2/2, biceps 2/2, triceps 2/2, patellar 2/2, Achilles 2/2, plantar responses were flexor bilaterally. Gait and Station: Rising up from seated position without assistance, normal stance,  moderate stride, good arm swing, smooth turning, able to perform tiptoe, and heel walking without difficulty. Tandem gait is steady  DIAGNOSTIC DATA (LABS, IMAGING, TESTING)   ASSESSMENT AND PLAN  34 y.o. year old female  has a past medical history of  Migraine (10/08/2014). here to follow-up. She is having about 1 headache per month currently well controlled on a combination of verapamil and Topamax.  Continue verapamil at current dose Continue Topamax at current dose Continue Maxalt at current dose will refill these meds Follow-up in 6-8 months Nilda Riggs, [facility], [facility], APRN  [facility] [address], [address] [place], [place] [address] [phone]

## 2015-07-11 NOTE — Patient Instructions (Signed)
Continue verapamil at current dose Continue Topamax at current dose Continue Maxalt at current dose will refill these meds Follow-up in 6-8 months

## 2015-07-18 ENCOUNTER — Ambulatory Visit: Payer: BLUE CROSS/BLUE SHIELD | Admitting: Nurse Practitioner

## 2016-01-12 ENCOUNTER — Ambulatory Visit: Payer: BC Managed Care – PPO | Admitting: Nurse Practitioner

## 2016-01-13 ENCOUNTER — Encounter: Payer: Self-pay | Admitting: Nurse Practitioner

## 2016-02-03 ENCOUNTER — Ambulatory Visit: Payer: BC Managed Care – PPO | Admitting: Nurse Practitioner

## 2016-02-04 ENCOUNTER — Encounter: Payer: Self-pay | Admitting: Nurse Practitioner

## 2016-02-04 ENCOUNTER — Telehealth: Payer: Self-pay | Admitting: *Deleted

## 2016-02-04 NOTE — Telephone Encounter (Signed)
Pt did not show for appt yesterday

## 2016-03-12 ENCOUNTER — Other Ambulatory Visit: Payer: Self-pay | Admitting: Nurse Practitioner

## 2016-03-16 ENCOUNTER — Other Ambulatory Visit: Payer: Self-pay | Admitting: Nurse Practitioner

## 2016-04-13 DIAGNOSIS — Z0289 Encounter for other administrative examinations: Secondary | ICD-10-CM

## 2016-04-14 ENCOUNTER — Encounter: Payer: Self-pay | Admitting: Nurse Practitioner

## 2016-04-14 ENCOUNTER — Ambulatory Visit (INDEPENDENT_AMBULATORY_CARE_PROVIDER_SITE_OTHER): Payer: BC Managed Care – PPO | Admitting: Nurse Practitioner

## 2016-04-14 VITALS — BP 105/71 | HR 73 | Ht 64.0 in | Wt 116.0 lb

## 2016-04-14 DIAGNOSIS — G43C0 Periodic headache syndromes in child or adult, not intractable: Secondary | ICD-10-CM

## 2016-04-14 MED ORDER — TOPIRAMATE 50 MG PO TABS: 100.0000 mg | ORAL_TABLET | Freq: Every day | ORAL | Status: DC

## 2016-04-14 NOTE — Progress Notes (Signed)
[facility]  PATIENT: [name] DOB: 01/08/1981   REASON FOR VISIT: Follow-up for migraine HISTORY FROM: Patient    HISTORY OF PRESENT ILLNESS:[name] is a 35 year old right-handed white female with a history of migraine headaches. The patient was last seen 07/11/2015. At that time she was on verapamil taking 120 mg SR tablet daily, and Topamax 100 mg with excellent control of her headaches  Her migraine triggers are her menstrual cycle and lack of sleep and weather changes. She missed several follow-up appointments and ran out of her verapamil in February and Topamax several weeks ago but her headaches have worsened. Maxalt continues to work acutely. She returns for reevaluation and discussion about continuing medications.   REVIEW OF SYSTEMS: Full 14 system review of systems performed and notable only for those listed, all others are neg:  Constitutional: neg  Cardiovascular: neg Ear/Nose/Throat: neg  Skin: neg Eyes: neg Respiratory: neg Gastroitestinal: neg  Hematology/Lymphatic: neg  Endocrine: neg Musculoskeletal:neg Allergy/Immunology: neg Neurological: Migraine Psychiatric: neg Sleep : neg   ALLERGIES: Allergies  Allergen Reactions  . Latex Rash    HOME MEDICATIONS: Outpatient Prescriptions Prior to Visit  Medication Sig Dispense Refill  . ibuprofen (ADVIL,MOTRIN) 600 MG tablet Take 1 tablet (600 mg total) by mouth every 6 (six) hours as needed. 30 tablet 1  . rizatriptan (MAXALT) 10 MG tablet Take 1 tablet (10 mg total) by mouth as needed for migraine. May repeat in 2 hours if needed 12 tablet 5  . sertraline (ZOLOFT) 50 MG tablet Take 50 mg by mouth daily.     Marland Kitchen. topiramate (TOPAMAX) 50 MG tablet Take 2 tablets (100 mg total) by mouth daily. (Patient not taking: Reported on 04/14/2016) 60 tablet 6  . verapamil (CALAN SR) 120 MG CR tablet Take 1 tablet (120 mg total) by mouth at bedtime. (Patient not taking: Reported on 04/14/2016) 30  tablet 6  . SUMAtriptan 6 MG/0.5ML SOAJ Inject 6 mg into the skin 2 (two) times daily as needed. Do not use with maxalt 4 cartridge 3   No facility-administered medications prior to visit.    PAST MEDICAL HISTORY: Past Medical History  Diagnosis Date  . Medical history non-contributory   . SVD (spontaneous vaginal delivery)     x 1  . Headache   . Depression   . Anxiety   . Migraine 10/08/2014    PAST SURGICAL HISTORY: Past Surgical History  Procedure Laterality Date  . Dilation and curettage of uterus    . Wisdom tooth extraction    . Cesarean section with bilateral tubal ligation Bilateral 03/05/2013    Procedure: CESAREAN SECTION WITH BILATERAL TUBAL LIGATION;  Surgeon: Meriel Picaichard M Holland, MD;  Location: [facility];  Service: Obstetrics;  Laterality: Bilateral;  Primary edc 03/09/13    FAMILY HISTORY: Family History  Problem Relation Age of Onset  . Diabetes Mellitus I Mother   . Cancer - Other Mother     breast  . Bipolar disorder Brother   . Melanoma Paternal Grandfather   . Cancer - Other Paternal Grandfather     throat  . Migraines Maternal Grandmother     SOCIAL HISTORY: Social History   Social History  . Marital Status: Married    Spouse Name: N/A  . Number of Children: 2  . Years of Education: N/A   Occupational History  .  [employer]   Social History Main Topics  . Smoking status: Never Smoker   . Smokeless tobacco: Never Used  .  Alcohol Use: Yes     Comment: one drink weekly  . Drug Use: No  . Sexual Activity: Yes    Birth Control/ Protection: None     Comment: pregnant   Other Topics Concern  . Not on file   Social History Narrative   Patient is right handed   Patient drinks about 2 cups caffeine daily.     PHYSICAL EXAM  Filed Vitals:   04/14/16 0823  BP: 105/71  Pulse: 73  Height:  (1.626 m)  Weight: 116 lb (52.617 kg)   Body mass index is 19.9 kg/(m^2). Generalized: Well developed, in no acute distress   Head: normocephalic and atraumatic,. Oropharynx benign  Neck: Supple, no carotid bruits  Musculoskeletal: No deformity   Neurological examination   Mentation: Alert oriented to time, place, history taking. Attention span and concentration appropriate. Recent and remote memory intact. Follows all commands speech and language fluent.   Cranial nerve II-XII: Pupils were equal round reactive to light extraocular movements were full, visual field were full on confrontational test. Facial sensation and strength were normal. hearing was intact to finger rubbing bilaterally. Uvula tongue midline. head turning and shoulder shrug were normal and symmetric.Tongue protrusion into cheek strength was normal. Motor: normal bulk and tone, full strength in the BUE, BLE,  Sensory: normal and symmetric to light touch, pinprick, and Vibration,  Coordination: finger-nose-finger, heel-to-shin bilaterally, no dysmetria Reflexes: Brachioradialis 2/2, biceps 2/2, triceps 2/2, patellar 2/2, Achilles 2/2, plantar responses were flexor bilaterally. Gait and Station: Rising up from seated position without assistance, normal stance, moderate stride, good arm swing, smooth turning, able to perform tiptoe, and heel walking without difficulty. Tandem gait is steady   DIAGNOSTIC DATA (LABS, IMAGING, TESTING) -  ASSESSMENT AND PLAN 35 y.o. year old female has a past medical history of Migraine (10/08/2014). here to follow-up. She is having more headaches since missing several appointments and running out of her medication. Weather changes, stress and her menstrual cycle are triggers.  PLAN: Restart  Topamax at  for 1 week then increase to 2 tabs daily Will hold off on verapamil for now Continue Maxalt at current dose Follow-up in 3 months Keep a record of your headaches and bring to the next visit Nilda Riggs, [facility], [facility], APRN  [facility] [address], [address] [place], [place]  [address] [phone]

## 2016-04-14 NOTE — Patient Instructions (Signed)
Restart  Topamax at current dose Continue Maxalt at current dose Follow-up in 3 to 4 months

## 2016-07-16 ENCOUNTER — Ambulatory Visit (INDEPENDENT_AMBULATORY_CARE_PROVIDER_SITE_OTHER): Payer: BC Managed Care – PPO | Admitting: Nurse Practitioner

## 2016-07-16 ENCOUNTER — Encounter: Payer: Self-pay | Admitting: Nurse Practitioner

## 2016-07-16 VITALS — BP 96/70 | HR 68 | Ht 64.0 in | Wt 113.0 lb

## 2016-07-16 DIAGNOSIS — G43C0 Periodic headache syndromes in child or adult, not intractable: Secondary | ICD-10-CM | POA: Diagnosis not present

## 2016-07-16 MED ORDER — TOPIRAMATE 50 MG PO TABS
100.0000 mg | ORAL_TABLET | Freq: Every day | ORAL | Status: DC
Start: 2016-07-16 — End: 2017-04-05

## 2016-07-16 MED ORDER — RIZATRIPTAN BENZOATE 10 MG PO TABS: 10.0000 mg | ORAL_TABLET | ORAL | Status: DC | PRN

## 2016-07-16 NOTE — Patient Instructions (Signed)
Continue  Topamax at 50mg  2 tabs daily Continue Maxalt at current dose Follow-up in 8 months

## 2016-07-16 NOTE — Progress Notes (Signed)
[facility]  PATIENT: [name] DOB: 07/18/1981   REASON FOR VISIT: Follow-up for headache HISTORY FROM: Patient    HISTORY OF PRESENT ILLNESS:[name] is a 35 year old right-handed white female with a history of migraine headaches. The patient was last seen 04/14/16. Her migraine triggers are her menstrual cycle and lack of sleep and weather changes. She missed several follow-up appointments and ran out of her  Topamax for several weeks and  headaches had worsened. Maxalt continues to work acutely. Topamax was restarted her headaches are doing well. She returns for reevaluation.   REVIEW OF SYSTEMS: Full 14 system review of systems performed and notable only for those listed, all others are neg:  Constitutional: neg  Cardiovascular: neg Ear/Nose/Throat: neg  Skin: neg Eyes: neg Respiratory: neg Gastroitestinal: neg  Hematology/Lymphatic: neg  Endocrine: neg Musculoskeletal:neg Allergy/Immunology: neg Neurological: Headaches Psychiatric: neg Sleep : neg   ALLERGIES: Allergies  Allergen Reactions  . Latex Rash    HOME MEDICATIONS: Outpatient Prescriptions Prior to Visit  Medication Sig Dispense Refill  . ibuprofen (ADVIL,MOTRIN) 600 MG tablet Take 1 tablet (600 mg total) by mouth every 6 (six) hours as needed. 30 tablet 1  . rizatriptan (MAXALT) 10 MG tablet Take 1 tablet (10 mg total) by mouth as needed for migraine. May repeat in 2 hours if needed 12 tablet 5  . sertraline (ZOLOFT) 50 MG tablet Take 50 mg by mouth daily.     Marland Kitchen topiramate (TOPAMAX) 50 MG tablet Take 2 tablets (100 mg total) by mouth daily. 60 tablet 6  . verapamil (CALAN SR) 120 MG CR tablet Take 1 tablet (120 mg total) by mouth at bedtime. (Patient not taking: Reported on 04/14/2016) 30 tablet 6   No facility-administered medications prior to visit.    PAST MEDICAL HISTORY: Past Medical History  Diagnosis Date  . Medical history non-contributory   . SVD (spontaneous  vaginal delivery)     x 1  . Headache   . Depression   . Anxiety   . Migraine 10/08/2014    PAST SURGICAL HISTORY: Past Surgical History  Procedure Laterality Date  . Dilation and curettage of uterus    . Wisdom tooth extraction    . Cesarean section with bilateral tubal ligation Bilateral 03/05/2013    Procedure: CESAREAN SECTION WITH BILATERAL TUBAL LIGATION;  Surgeon: Meriel Pica, MD;  Location: [facility];  Service: Obstetrics;  Laterality: Bilateral;  Primary edc 03/09/13    FAMILY HISTORY: Family History  Problem Relation Age of Onset  . Diabetes Mellitus I Mother   . Cancer - Other Mother     breast  . Bipolar disorder Brother   . Melanoma Paternal Grandfather   . Cancer - Other Paternal Grandfather     throat  . Migraines Maternal Grandmother     SOCIAL HISTORY: Social History   Social History  . Marital Status: Married    Spouse Name: N/A  . Number of Children: 2  . Years of Education: N/A   Occupational History  .  [employer]   Social History Main Topics  . Smoking status: Never Smoker   . Smokeless tobacco: Never Used  . Alcohol Use: Yes     Comment: one drink weekly  . Drug Use: No  . Sexual Activity: Yes    Birth Control/ Protection: None     Comment: pregnant   Other Topics Concern  . Not on file   Social History Narrative   Patient is right  handed   Patient drinks about 2 cups caffeine daily.     PHYSICAL EXAM  Filed Vitals:   07/16/16 0848  BP: 96/70  Pulse: 68  Height: 5\' 4"  (1.626 m)  Weight: 113 lb (51.256 kg)   Body mass index is 19.39 kg/(m^2). Generalized: Well developed, in no acute distress  Head: normocephalic and atraumatic,. Oropharynx benign  Neck: Supple, no carotid bruits  Musculoskeletal: No deformity   Neurological examination   Mentation: Alert oriented to time, place, history taking. Attention span and concentration appropriate. Recent and remote memory intact. Follows all commands speech  and language fluent.   Cranial nerve II-XII: Pupils were equal round reactive to light extraocular movements were full, visual field were full on confrontational test. Facial sensation and strength were normal. hearing was intact to finger rubbing bilaterally. Uvula tongue midline. head turning and shoulder shrug were normal and symmetric.Tongue protrusion into cheek strength was normal. Motor: normal bulk and tone, full strength in the BUE, BLE,  Sensory: normal and symmetric to light touch, pinprick, and Vibration,  Coordination: finger-nose-finger, heel-to-shin bilaterally, no dysmetria Reflexes: Brachioradialis 2/2, biceps 2/2, triceps 2/2, patellar 2/2, Achilles 2/2, plantar responses were flexor bilaterally. Gait and Station: Rising up from seated position without assistance, normal stance, moderate stride, good arm swing, smooth turning, able to perform tiptoe, and heel walking without difficulty. Tandem gait is steady DIAGNOSTIC DATA (LABS, IMAGING, TESTING) - ASSESSMENT AND PLAN 35 y.o. year old female has a past medical history of Migraine (10/08/2014). here to follow-up.  Weather changes, stress and her menstrual cycle are triggers.  PLAN: Continue Topamax at 50mg  2 tabs daily Continue Maxalt at current dose Follow-up in 6 to 8 months Nilda RiggsNancy Carolyn Juvia Aerts, [facility] LPGNP, [facility], APRN  [facility] [address], [address] [place], [place] [address] [phone]

## 2016-07-16 NOTE — Progress Notes (Signed)
I have read the note, and I agree with the clinical assessment and plan.  Reneisha Stilley KEITH   

## 2016-12-11 ENCOUNTER — Ambulatory Visit (HOSPITAL_COMMUNITY)
Admission: EM | Admit: 2016-12-11 | Discharge: 2016-12-11 | Disposition: A | Discharge status: Home / Self Care | Payer: BC Managed Care – PPO | Attending: Family Medicine | Admitting: Family Medicine

## 2016-12-11 ENCOUNTER — Encounter (HOSPITAL_COMMUNITY): Payer: Self-pay | Admitting: Emergency Medicine

## 2016-12-11 DIAGNOSIS — J029 Acute pharyngitis, unspecified: Secondary | ICD-10-CM | POA: Insufficient documentation

## 2016-12-11 DIAGNOSIS — J028 Acute pharyngitis due to other specified organisms: Secondary | ICD-10-CM | POA: Diagnosis not present

## 2016-12-11 LAB — POCT RAPID STREP A: STREPTOCOCCUS, GROUP A SCREEN (DIRECT): NEGATIVE

## 2016-12-11 MED ORDER — AMOXICILLIN 875 MG PO TABS: 875.0000 mg | ORAL_TABLET | Freq: Two times a day (BID) | ORAL | 0 refills | Status: DC

## 2016-12-11 NOTE — ED Triage Notes (Signed)
Here for ST onset this am... Reports son tested pos for strep and is taking antibiotics  Sx today include: dysphagia, fevers, nasal drainage  A&O x4... NAD

## 2016-12-11 NOTE — ED Provider Notes (Signed)
CSN: [id number]     Arrival date & time 12/11/16  1738 History   First MD Initiated Contact with Patient 12/11/16 1848     Chief Complaint  Patient presents with  . Sore Throat   (Consider location/radiation/quality/duration/timing/severity/associated sxs/prior Treatment) Patient c/o sore throat and fever today.     The history is provided by the patient.  Sore Throat  This is a new problem. The current episode started 12 to 24 hours ago. The problem occurs constantly. The problem has not changed since onset.Nothing aggravates the symptoms. Nothing relieves the symptoms. She has tried nothing for the symptoms.    Past Medical History:  Diagnosis Date  . Anxiety   . Depression   . Headache   . Medical history non-contributory   . Migraine 10/08/2014  . SVD (spontaneous vaginal delivery)    x 1   Past Surgical History:  Procedure Laterality Date  . CESAREAN SECTION WITH BILATERAL TUBAL LIGATION Bilateral 03/05/2013   Procedure: CESAREAN SECTION WITH BILATERAL TUBAL LIGATION;  Surgeon: Meriel Picaichard M Holland, MD;  Location: [facility];  Service: Obstetrics;  Laterality: Bilateral;  Primary edc 03/09/13  . DILATION AND CURETTAGE OF UTERUS    . WISDOM TOOTH EXTRACTION     Family History  Problem Relation Age of Onset  . Diabetes Mellitus I Mother   . Cancer - Other Mother     breast  . Bipolar disorder Brother   . Migraines Maternal Grandmother   . Melanoma Paternal Grandfather   . Cancer - Other Paternal Grandfather     throat   Social History  Substance Use Topics  . Smoking status: Never Smoker  . Smokeless tobacco: Never Used  . Alcohol use Yes     Comment: one drink weekly   OB History    Gravida Para Term Preterm AB Living   3 2 2  0 1 2   SAB TAB Ectopic Multiple Live Births   1 0 0 0 2     Review of Systems  Constitutional: Positive for fatigue and fever.  HENT: Positive for sore throat.   Eyes: Negative.   Respiratory: Negative.   Cardiovascular: Negative.    Gastrointestinal: Negative.   Endocrine: Negative.   Genitourinary: Negative.   Musculoskeletal: Negative.   Skin: Negative.   Allergic/Immunologic: Negative.   Neurological: Negative.   Hematological: Negative.   Psychiatric/Behavioral: Negative.     Allergies  Latex  Home Medications   Prior to Admission medications   Medication Sig Start Date End Date Taking? Authorizing Provider  ibuprofen (ADVIL,MOTRIN) 600 MG tablet Take 1 tablet (600 mg total) by mouth every 6 (six) hours as needed. 03/07/13  Yes Julio Sicksarol Curtis, NP  rizatriptan (MAXALT) 10 MG tablet Take 1 tablet (10 mg total) by mouth as needed for migraine. May repeat in 2 hours if needed 07/16/16  Yes Nilda RiggsNancy Carolyn Martin, NP  topiramate (TOPAMAX) 50 MG tablet Take 2 tablets (100 mg total) by mouth daily. 07/16/16  Yes Nilda RiggsNancy Carolyn Martin, NP  amoxicillin (AMOXIL) 875 MG tablet Take 1 tablet (875 mg total) by mouth 2 (two) times daily. 12/11/16   Deatra CanterWilliam J Delorice Bannister, FNP  sertraline (ZOLOFT) 50 MG tablet Take 50 mg by mouth daily.  06/10/15   Historical Provider, MD   Meds Ordered and Administered this Visit  Medications - No data to display  BP 109/70 (BP Location: Left Arm)   Pulse 92   Temp 98.2 F (36.8 C) (Oral)   Resp 20   LMP 11/28/2016  SpO2 100%  No data found.   Physical Exam  Constitutional: She appears well-developed and well-nourished.  HENT:  Head: Normocephalic.  Right Ear: External ear normal.  Left Ear: External ear normal.  Nose: Nose normal.  opx erythematous   Eyes: Conjunctivae and EOM are normal. Pupils are equal, round, and reactive to light.  Neck: Normal range of motion. Neck supple.  Cardiovascular: Normal rate, regular rhythm and normal heart sounds.   Pulmonary/Chest: Effort normal and breath sounds normal.  Abdominal: Soft.  Nursing note and vitals reviewed.   Urgent Care Course   Clinical Course     Procedures (including critical care time)  Labs Review Labs Reviewed   POCT RAPID STREP A    Imaging Review No results found.   Visual Acuity Review  Right Eye Distance:   Left Eye Distance:   Bilateral Distance:    Right Eye Near:   Left Eye Near:    Bilateral Near:         MDM   1. Acute pharyngitis due to other specified organisms    Amoxicillin 875mg  one po bid x 7 days #14 Push po fluids, rest, tylenol and motrin otc prn as directed for fever, arthralgias, and myalgias.  Follow up prn if sx's continue or persist.    Deatra CanterWilliam J Sendy Pluta, FNP 12/11/16 1901

## 2016-12-14 LAB — CULTURE, GROUP A STREP (THRC)

## 2017-03-16 ENCOUNTER — Ambulatory Visit: Payer: BC Managed Care – PPO | Admitting: Nurse Practitioner

## 2017-04-05 ENCOUNTER — Encounter: Payer: Self-pay | Admitting: Nurse Practitioner

## 2017-04-05 ENCOUNTER — Ambulatory Visit (INDEPENDENT_AMBULATORY_CARE_PROVIDER_SITE_OTHER): Payer: BC Managed Care – PPO | Admitting: Nurse Practitioner

## 2017-04-05 ENCOUNTER — Encounter (INDEPENDENT_AMBULATORY_CARE_PROVIDER_SITE_OTHER): Payer: Self-pay

## 2017-04-05 DIAGNOSIS — G43009 Migraine without aura, not intractable, without status migrainosus: Secondary | ICD-10-CM

## 2017-04-05 MED ORDER — RIZATRIPTAN BENZOATE 10 MG PO TABS
10.0000 mg | ORAL_TABLET | ORAL | 6 refills | Status: DC | PRN
Start: 2017-04-05 — End: 2017-10-10

## 2017-04-05 MED ORDER — TOPIRAMATE 50 MG PO TABS: 100.0000 mg | ORAL_TABLET | Freq: Every day | ORAL | 6 refills | Status: DC

## 2017-04-05 NOTE — Progress Notes (Signed)
[facility]  PATIENT: [name] DOB: Nov 28, 1981   REASON FOR VISIT: Follow-up for headache/migraine HISTORY FROM: Patient    HISTORY OF PRESENT ILLNESS:[name] is a 36 year old right-handed white female with a history of migraine headaches. Her migraine triggers are her menstrual cycle and lack of sleep and weather changes. She has missed doses of her Topamax for several weeks and  headaches had worsened. Maxalt continues to work acutely. She was recently placed on Pristiq for anxiety depression. She also complains of occasional neck tension. 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 She returns for reevaluation.   REVIEW OF SYSTEMS: Full 14 system review of systems performed and notable only for those listed, all others are neg:  Constitutional: neg  Cardiovascular: neg Ear/Nose/Throat: neg  Skin: neg Eyes: neg Respiratory: neg Gastroitestinal: neg  Hematology/Lymphatic: neg  Endocrine: neg Musculoskeletal:neg Allergy/Immunology: neg Neurological: Headaches Psychiatric: Anxiety depression Sleep : neg   ALLERGIES: Allergies  Allergen Reactions  . Latex Rash    HOME MEDICATIONS: Outpatient Medications Prior to Visit  Medication Sig Dispense Refill  . ibuprofen (ADVIL,MOTRIN) 600 MG tablet Take 1 tablet (600 mg total) by mouth every 6 (six) hours as needed. 30 tablet 1  . rizatriptan (MAXALT) 10 MG tablet Take 1 tablet (10 mg total) by mouth as needed for migraine. May repeat in 2 hours if needed 12 tablet 8  . topiramate (TOPAMAX) 50 MG tablet Take 2 tablets (100 mg total) by mouth daily. 60 tablet 6  . amoxicillin (AMOXIL) 875 MG tablet Take 1 tablet (875 mg total) by mouth 2 (two) times daily. 14 tablet 0  . sertraline (ZOLOFT) 50 MG tablet Take 50 mg by mouth daily.      No facility-administered medications prior to visit.     PAST MEDICAL HISTORY: Past Medical History:  Diagnosis Date  . Anxiety   . Depression   .  Headache   . Medical history non-contributory   . Migraine 10/08/2014  . SVD (spontaneous vaginal delivery)    x 1    PAST SURGICAL HISTORY: Past Surgical History:  Procedure Laterality Date  . CESAREAN SECTION WITH BILATERAL TUBAL LIGATION Bilateral 03/05/2013   Procedure: CESAREAN SECTION WITH BILATERAL TUBAL LIGATION;  Surgeon: Meriel Pica, MD;  Location: [facility];  Service: Obstetrics;  Laterality: Bilateral;  Primary edc 03/09/13  . DILATION AND CURETTAGE OF UTERUS    . WISDOM TOOTH EXTRACTION      FAMILY HISTORY: Family History  Problem Relation Age of Onset  . Diabetes Mellitus I Mother   . Cancer - Other Mother     breast  . Bipolar disorder Brother   . Migraines Maternal Grandmother   . Melanoma Paternal Grandfather   . Cancer - Other Paternal Grandfather     throat    SOCIAL HISTORY: Social History   Social History  . Marital status: Married    Spouse name: N/A  . Number of children: 2  . Years of education: N/A   Occupational History  .  [employer]   Social History Main Topics  . Smoking status: Never Smoker  . Smokeless tobacco: Never Used  . Alcohol use Yes     Comment: one drink weekly  . Drug use: No  . Sexual activity: Yes    Birth control/ protection: None     Comment: pregnant   Other Topics Concern  . Not on file   Social History Narrative   Patient  is right handed   Patient drinks about 2 cups caffeine daily.     PHYSICAL EXAM  Vitals:   04/05/17 0734  BP: 102/70  Pulse: 90  Weight: 115 lb (52.2 kg)  Height:  (1.626 m)   Body mass index is 19.74 kg/m. Generalized: Well developed, in no acute distress  Head: normocephalic and atraumatic,. Oropharynx benign  Neck: Supple, paraspinals tender to palpation Musculoskeletal: No deformity   Neurological examination   Mentation: Alert oriented to time, place, history taking. Attention span and concentration appropriate. Recent and remote memory intact.  Follows all commands speech and language fluent.   Cranial nerve II-XII: Visual acuity 20/30 right 20/20 left. Pupils were equal round reactive to light extraocular movements were full, visual field were full on confrontational test. Facial sensation and strength were normal. hearing was intact to finger rubbing bilaterally. Uvula tongue midline. head turning and shoulder shrug were normal and symmetric.Tongue protrusion into cheek strength was normal. Motor: normal bulk and tone, full strength in the BUE, BLE,  Sensory: normal and symmetric to light touch, pinprick, and Vibration in the upper and lower extremities,  Coordination: finger-nose-finger, heel-to-shin bilaterally, no dysmetria Reflexes: Brachioradialis 2/2, biceps 2/2, triceps 2/2, patellar 2/2, Achilles 2/2, plantar responses were flexor bilaterally. Gait and Station: Rising up from seated position without assistance, normal stance, moderate stride, good arm swing, smooth turning, able to perform tiptoe, and heel walking without difficulty. Tandem gait is steady DIAGNOSTIC DATA (LABS, IMAGING, TESTING) - ASSESSMENT AND PLAN 36 y.o. year old female has a past medical history of Migraine (10/08/2014). here to follow-up.  Weather changes, stress and her menstrual cycle are triggers. Complains of tenderness in her paraspinals.  PLAN: Continue Topamax at  2 tabs daily will refill Continue Maxalt at current dose will refill Neck exercises for tension in paraspinals patient does not want to be placed on additional medication for this at this time If headaches worsen record on Migraine tracker APP Follow-up in 6  Months I spent 20 min  in total face to face time with the patient more than 50% of which was spent counseling and coordination of care, reviewing test results reviewing medications and discussing and reviewing the diagnosis of migraine and tenderness in the paraspinals. Since she does not wish to be placed on meds given  neck exercises and advised of  and further treatment options. , Cline Crock, [facility], APRN  [facility] [address] [place], [place] [address] [phone]

## 2017-04-05 NOTE — Patient Instructions (Addendum)
Continue Topamax at  2 tabs daily Continue Maxalt at current dose Neck exercises for tension in paraspinals If headaches worsen record om Migraine tracker APP Follow-up in 6  months  Neck Exercises Neck exercises can be important for many reasons:  They can help you to improve and maintain flexibility in your neck. This can be especially important as you age.  They can help to make your neck stronger. This can make movement easier.  They can reduce or prevent neck pain.  They may help your upper back. Ask your health care provider which neck exercises would be best for you. Exercises Neck Press  Repeat this exercise 10 times. Do it first thing in the morning and right before bed or as told by your health care provider. 1. Lie on your back on a firm bed or on the floor with a pillow under your head. 2. Use your neck muscles to push your head down on the pillow and straighten your spine. 3. Hold the position as well as you can. Keep your head facing up and your chin tucked. 4. Slowly count to 5 while holding this position. 5. Relax for a few seconds. Then repeat. Isometric Strengthening  Do a full set of these exercises 2 times a day or as told by your health care provider. 1. Sit in a supportive chair and place your hand on your forehead. 2. Push forward with your head and neck while pushing back with your hand. Hold for 10 seconds. 3. Relax. Then repeat the exercise 3 times. 4. Next, do thesequence again, this time putting your hand against the back of your head. Use your head and neck to push backward against the hand pressure. 5. Finally, do the same exercise on either side of your head, pushing sideways against the pressure of your hand. Prone Head Lifts  Repeat this exercise 5 times. Do this 2 times a day or as told by your health care provider. 1. Lie face-down, resting on your elbows so that your chest and upper back are raised. 2. Start with your head facing downward,  near your chest. Position your chin either on or near your chest. 3. Slowly lift your head upward. Lift until you are looking straight ahead. Then continue lifting your head as far back as you can stretch. 4. Hold your head up for 5 seconds. Then slowly lower it to your starting position. Supine Head Lifts  Repeat this exercise 8-10 times. Do this 2 times a day or as told by your health care provider. 1. Lie on your back, bending your knees to point to the ceiling and keeping your feet flat on the floor. 2. Lift your head slowly off the floor, raising your chin toward your chest. 3. Hold for 5 seconds. 4. Relax and repeat. Scapular Retraction  Repeat this exercise 5 times. Do this 2 times a day or as told by your health care provider. 1. Stand with your arms at your sides. Look straight ahead. 2. Slowly pull both shoulders backward and downward until you feel a stretch between your shoulder blades in your upper back. 3. Hold for 10-30 seconds. 4. Relax and repeat. Contact a health care provider if:  Your neck pain or discomfort gets much worse when you do an exercise.  Your neck pain or discomfort does not improve within 2 hours after you exercise. If you have any of these problems, stop exercising right away. Do not do the exercises again unless your health care provider  says that you can. Get help right away if:  You develop sudden, severe neck pain. If this happens, stop exercising right away. Do not do the exercises again unless your health care provider says that you can. Exercises Neck Stretch  Repeat this exercise 3-5 times. 1. Do this exercise while standing or while sitting in a chair. 2. Place your feet flat on the floor, shoulder-width apart. 3. Slowly turn your head to the right. Turn it all the way to the right so you can look over your right shoulder. Do not tilt or tip your head. 4. Hold this position for 10-30 seconds. 5. Slowly turn your head to the left, to look over  your left shoulder. 6. Hold this position for 10-30 seconds. Neck Retraction Repeat this exercise 8-10 times. Do this 3-4 times a day or as told by your health care provider. 1. Do this exercise while standing or while sitting in a sturdy chair. 2. Look straight ahead. Do not bend your neck. 3. Use your fingers to push your chin backward. Do not bend your neck for this movement. Continue to face straight ahead. If you are doing the exercise properly, you will feel a slight sensation in your throat and a stretch at the back of your neck. 4. Hold the stretch for 1-2 seconds. Relax and repeat. This information is not intended to replace advice given to you by your health care provider. Make sure you discuss any questions you have with your health care provider. Document Released: 11/24/2015 Document Revised: 05/20/2016 Document Reviewed: 06/23/2015 Elsevier Interactive Patient Education  2017 [employer].

## 2017-07-17 ENCOUNTER — Ambulatory Visit (INDEPENDENT_AMBULATORY_CARE_PROVIDER_SITE_OTHER): Payer: BC Managed Care – PPO

## 2017-07-17 ENCOUNTER — Encounter (HOSPITAL_COMMUNITY): Payer: Self-pay | Admitting: *Deleted

## 2017-07-17 ENCOUNTER — Ambulatory Visit (HOSPITAL_COMMUNITY)
Admission: EM | Admit: 2017-07-17 | Discharge: 2017-07-17 | Disposition: A | Discharge status: Home / Self Care | Payer: BC Managed Care – PPO | Attending: Physician Assistant | Admitting: Physician Assistant

## 2017-07-17 DIAGNOSIS — M25532 Pain in left wrist: Secondary | ICD-10-CM

## 2017-07-17 DIAGNOSIS — W19XXXA Unspecified fall, initial encounter: Secondary | ICD-10-CM

## 2017-07-17 DIAGNOSIS — S52125A Nondisplaced fracture of head of left radius, initial encounter for closed fracture: Secondary | ICD-10-CM | POA: Diagnosis not present

## 2017-07-17 DIAGNOSIS — S52325A Nondisplaced transverse fracture of shaft of left radius, initial encounter for closed fracture: Secondary | ICD-10-CM | POA: Diagnosis not present

## 2017-07-17 DIAGNOSIS — M79602 Pain in left arm: Secondary | ICD-10-CM

## 2017-07-17 MED ORDER — IBUPROFEN 800 MG PO TABS
800.0000 mg | ORAL_TABLET | Freq: Once | ORAL | Status: AC
  Administered 2017-07-17: 800 mg via ORAL

## 2017-07-17 MED ORDER — IBUPROFEN 800 MG PO TABS: 800.0000 mg | ORAL_TABLET | Freq: Three times a day (TID) | ORAL | 0 refills | Status: DC

## 2017-07-17 MED ORDER — IBUPROFEN 800 MG PO TABS
ORAL_TABLET | ORAL | Status: AC
  Filled 2017-07-17: qty 1

## 2017-07-17 MED ORDER — HYDROCODONE-ACETAMINOPHEN 5-325 MG PO TABS: 1.0000 | ORAL_TABLET | Freq: Four times a day (QID) | ORAL | 0 refills | Status: DC | PRN

## 2017-07-17 NOTE — Discharge Instructions (Addendum)
Try not to miss doses of ibuprofen. If you can not rest due to the pain this okay to take the narcotic.

## 2017-07-17 NOTE — ED Notes (Signed)
Sling  lond g  Arm  Splint  Applied  By  Milon Dikesrtho  Tech

## 2017-07-17 NOTE — ED Provider Notes (Signed)
07/17/2017 8:34 PM   DOB: 04/10/1981 / MRN: [id number]  SUBJECTIVE:  [name] is a 36 y.o. female presenting for forearm pain that started after falling off of her sons skateboard.  This occurred today. She tells me she has a difficult time pronating her arm. Denies any change in function in the hand. Denies numbness in the hand.   She is allergic to latex.   She  has a past medical history of Anxiety; Depression; Headache; Medical history non-contributory; Migraine (10/08/2014); and SVD (spontaneous vaginal delivery).    She  reports that she has never smoked. She has never used smokeless tobacco. She reports that she drinks alcohol. She reports that she does not use drugs. She  reports that she currently engages in sexual activity. She reports using the following method of birth control/protection: None. The patient  has a past surgical history that includes Dilation and curettage of uterus; Wisdom tooth extraction; and Cesarean section with bilateral tubal ligation (Bilateral, 03/05/2013).  Her family history includes Bipolar disorder in her brother; Cancer - Other in her mother and paternal grandfather; Diabetes Mellitus I in her mother; Melanoma in her paternal grandfather; Migraines in her maternal grandmother.  Review of Systems  Constitutional: Negative for chills and fever.  Musculoskeletal: Positive for falls and joint pain. Negative for back pain, myalgias and neck pain.  Neurological: Negative for dizziness.    OBJECTIVE:  BP 130/70 (BP Location: Right Arm)   Pulse 78   Temp 98.6 F (37 C) (Oral)   Resp 18   LMP 07/01/2017   SpO2 100%   Physical Exam  Constitutional: She is active.  Non-toxic appearance.  Cardiovascular: Normal rate.   Pulmonary/Chest: Effort normal. No tachypnea.  Musculoskeletal:       Left elbow: She exhibits decreased range of motion. She exhibits no swelling. Tenderness found. Radial head tenderness noted. No medial epicondyle, no lateral  epicondyle and no olecranon process tenderness noted.       Left forearm: She exhibits bony tenderness.       Arms: Neurological: She is alert.  Skin: Skin is warm and dry. She is not diaphoretic. No pallor.    No results found for this or any previous visit (from the past 72 hour(s)).  Dg Humerus Left  Result Date: 07/17/2017 CLINICAL DATA:  Larey SeatFell 2 hours ago from skateboard. Distal upper arm pain. EXAM: LEFT HUMERUS - 2+ VIEW COMPARISON:  None. FINDINGS: No evidence of humeral fracture. There does appear to be radial head fracture. Elbow films recommended. IMPRESSION: Negative for humeral fracture. Radial head fracture shown. Recommend elbow films. Electronically Signed   By: Paulina FusiMark  Shogry M.D.   On: 07/17/2017 19:32    ASSESSMENT AND PLAN:  The primary encounter diagnosis was Arthralgia of left forearm. Diagnoses of Fall, initial encounter and Closed nondisplaced fracture of head of left radius, initial encounter were also pertinent to this visit. Posterior arm splint here and ibuprofen and short term narcotic for pain.  She will call Dr. August Saucerean in the morning who is on call for orthopedics.    The patient is advised to call or return to clinic if she does not see an improvement in symptoms, or to seek the care of the closest emergency department if she worsens with the above plan.   Deliah BostonMichael Veleda Mun, MHS, PA-C 07/17/2017 8:34 PM

## 2017-07-17 NOTE — Progress Notes (Signed)
Orthopedic Tech Progress Note Patient Details:  [name] 09/13/1981 [id number]  Ortho Devices Type of Ortho Device: Ace wrap, Arm sling, Post (long arm) splint Ortho Device/Splint Location: LUE Ortho Device/Splint Interventions: Ordered, Application   Jennye MoccasinHughes, [name] 07/17/2017, 9:11 PM

## 2017-07-17 NOTE — ED Triage Notes (Signed)
Pt   Reports    She    Fell    Off   Skateboard   -      inj     To  l   Wrist

## 2017-07-18 ENCOUNTER — Encounter (INDEPENDENT_AMBULATORY_CARE_PROVIDER_SITE_OTHER): Payer: Self-pay | Admitting: Orthopedic Surgery

## 2017-07-18 ENCOUNTER — Ambulatory Visit (INDEPENDENT_AMBULATORY_CARE_PROVIDER_SITE_OTHER): Payer: BC Managed Care – PPO | Admitting: Orthopedic Surgery

## 2017-07-18 DIAGNOSIS — S52125A Nondisplaced fracture of head of left radius, initial encounter for closed fracture: Secondary | ICD-10-CM

## 2017-07-18 NOTE — Progress Notes (Signed)
Office Visit Note   Patient: [name]           Date of Birth: 1981/06/10           MRN: [id number] Visit Date: 07/18/2017 Requested by: Loyal Jacobson, MD [address] [place], [place] [address] PCP: Loyal Jacobson, MD  Subjective: Chief Complaint  Patient presents with  . 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  She is right-hand dominant.  Taking ibuprofen and some Norco prescribed for her by the emergency room.  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  She is working this summer.  She is going to the beach next week.              ROS: All systems reviewed are negative as they relate to the chief complaint within the history of present illness.  Patient denies  fevers or chills.   Assessment & Plan: Visit Diagnoses: No diagnosis found.  Plan: Impression is left elbow pain with no corresponding distal radial ulnar joint symptoms and some pain with pronation supination of the left wrist.  No mechanical block to motion.  Radiographs show nondisplaced radial head fracture.  Plan at this time is to keep her in a sling but the D or elbow range of motion exercises with no lifting and no pressing.  Come back in 10 days repeat clinical check radiographs possible release at that time depending on how she is doing clinically.  Follow-Up Instructions: Return in about 10 days (around 07/28/2017).   Orders:  No orders of the defined types were placed in this encounter.  No orders of the defined types were placed in this encounter.     Procedures: No procedures performed   Clinical Data: No additional findings.  Objective: Vital Signs: LMP 07/01/2017   Physical Exam:   Constitutional: Patient appears well-developed HEENT:  Head: Normocephalic Eyes:EOM are normal Neck: Normal range of  motion Cardiovascular: Normal rate Pulmonary/chest: Effort normal Neurologic: Patient is alert Skin: Skin is warm Psychiatric: Patient has normal mood and affect    Ortho Exam: With peak exam demonstrates tenderness to palpation of the left elbow but no tenderness around the left wrist or left shoulder.  No crepitus noted in this region.  No other masses lymph adenopathy or skin changes noted in the left elbow region.  Specialty Comments:  No specialty comments available.  Imaging: No results found.   PMFS History: Patient Active Problem List   Diagnosis Date Noted  . Common migraine 04/05/2017  . Migraine 10/08/2014   Past Medical History:  Diagnosis Date  . Anxiety   . Depression   . Headache   . Medical history non-contributory   . Migraine 10/08/2014  . SVD (spontaneous vaginal delivery)    x 1    Family History  Problem Relation Age of Onset  . Diabetes Mellitus I Mother   . Cancer - Other Mother        breast  . Bipolar disorder Brother   . Migraines Maternal Grandmother   . Melanoma Paternal Grandfather   . Cancer - Other Paternal Grandfather        throat    Past Surgical History:  Procedure Laterality Date  . CESAREAN SECTION WITH BILATERAL TUBAL LIGATION Bilateral 03/05/2013   Procedure: CESAREAN SECTION WITH BILATERAL TUBAL LIGATION;  Surgeon: Meriel Picaichard M Holland, MD;  Location: [facility];  Service: Obstetrics;  Laterality: Bilateral;  Primary edc 03/09/13  . DILATION AND CURETTAGE OF UTERUS    . WISDOM TOOTH EXTRACTION     Social History   Occupational History  .  [employer]   Social History Main Topics  . Smoking status: Never Smoker  . Smokeless tobacco: Never Used  . Alcohol use Yes     Comment: one drink weekly  . Drug use: No  . Sexual activity: Yes    Birth control/ protection: None     Comment: pregnant

## 2017-07-20 ENCOUNTER — Telehealth (INDEPENDENT_AMBULATORY_CARE_PROVIDER_SITE_OTHER): Payer: Self-pay

## 2017-07-20 NOTE — Telephone Encounter (Signed)
Patient called and left voicemail on triage phone. States she was seen on Monday and states her fingers are cold and pale and would like a CB to see if this is normal.  [phone]

## 2017-07-20 NOTE — Telephone Encounter (Signed)
Called & discussed with patient

## 2017-08-01 ENCOUNTER — Ambulatory Visit (INDEPENDENT_AMBULATORY_CARE_PROVIDER_SITE_OTHER): Payer: BC Managed Care – PPO

## 2017-08-01 ENCOUNTER — Encounter (INDEPENDENT_AMBULATORY_CARE_PROVIDER_SITE_OTHER): Payer: Self-pay | Admitting: Orthopedic Surgery

## 2017-08-01 ENCOUNTER — Ambulatory Visit (INDEPENDENT_AMBULATORY_CARE_PROVIDER_SITE_OTHER): Payer: BC Managed Care – PPO | Admitting: Orthopedic Surgery

## 2017-08-01 DIAGNOSIS — S52125D Nondisplaced fracture of head of left radius, subsequent encounter for closed fracture with routine healing: Secondary | ICD-10-CM | POA: Diagnosis not present

## 2017-08-01 DIAGNOSIS — S52123A Displaced fracture of head of unspecified radius, initial encounter for closed fracture: Secondary | ICD-10-CM | POA: Insufficient documentation

## 2017-08-02 NOTE — Progress Notes (Signed)
   Post-Op Visit Note   Patient: [name]           Date of Birth: 11/29/1981           MRN: [id number] Visit Date: 08/01/2017 PCP: Loyal JacobsonKalish, Michael, MD   Assessment & Plan:  Chief Complaint:  Chief Complaint  Patient presents with  . Left Elbow - Follow-up, Fracture   Visit Diagnoses:  1. Closed nondisplaced fracture of head of left radius with routine healing, subsequent encounter     Plan: Lanore underwent left radial head fracture 07/17/2017.  She's doing better.  Pain is improving.  She works as a Runner, broadcasting/film/videoteacher.  On exam she actually has full extension.  Flexion full pronation.  Supination only mild tenderness over the radial head.  Continue for 1 more week in the sling.  Asked him lifting 5 pounds.  Return to clinic in 4 weeks for clinical recheck only and likely release to full activity.  Radiographs today showed good alignment of the fracture area and  Follow-Up Instructions: Return in about 4 weeks (around 08/29/2017).   Orders:  Orders Placed This Encounter  Procedures  . XR Elbow Complete Left (3+View)   No orders of the defined types were placed in this encounter.   Imaging: No results found.  PMFS History: Patient Active Problem List   Diagnosis Date Noted  . Radial head fracture 08/01/2017  . Common migraine 04/05/2017  . Migraine 10/08/2014   Past Medical History:  Diagnosis Date  . Anxiety   . Depression   . Headache   . Medical history non-contributory   . Migraine 10/08/2014  . SVD (spontaneous vaginal delivery)    x 1    Family History  Problem Relation Age of Onset  . Diabetes Mellitus I Mother   . Cancer - Other Mother        breast  . Bipolar disorder Brother   . Migraines Maternal Grandmother   . Melanoma Paternal Grandfather   . Cancer - Other Paternal Grandfather        throat    Past Surgical History:  Procedure Laterality Date  . CESAREAN SECTION WITH BILATERAL TUBAL LIGATION Bilateral 03/05/2013   Procedure: CESAREAN SECTION WITH  BILATERAL TUBAL LIGATION;  Surgeon: Meriel Picaichard M Holland, MD;  Location: [facility];  Service: Obstetrics;  Laterality: Bilateral;  Primary edc 03/09/13  . DILATION AND CURETTAGE OF UTERUS    . WISDOM TOOTH EXTRACTION     Social History   Occupational History  .  [employer]   Social History Main Topics  . Smoking status: Never Smoker  . Smokeless tobacco: Never Used  . Alcohol use Yes     Comment: one drink weekly  . Drug use: No  . Sexual activity: Yes    Birth control/ protection: None     Comment: pregnant

## 2017-08-31 ENCOUNTER — Encounter (INDEPENDENT_AMBULATORY_CARE_PROVIDER_SITE_OTHER): Payer: Self-pay | Admitting: Orthopedic Surgery

## 2017-08-31 ENCOUNTER — Ambulatory Visit (INDEPENDENT_AMBULATORY_CARE_PROVIDER_SITE_OTHER): Payer: BC Managed Care – PPO | Admitting: Orthopedic Surgery

## 2017-08-31 DIAGNOSIS — S52125D Nondisplaced fracture of head of left radius, subsequent encounter for closed fracture with routine healing: Secondary | ICD-10-CM

## 2017-08-31 NOTE — Progress Notes (Signed)
   Post-Op Visit Note   Patient: [name]           Date of Birth: 04/02/1981           MRN: [id number] Visit Date: 08/31/2017 PCP: Loyal JacobsonKalish, Michael, MD   Assessment & Plan:  Chief Complaint:  Chief Complaint  Patient presents with  . Left Elbow - Follow-up   Visit Diagnoses: No diagnosis found.  Plan: Efrain Sellabbey is a 36 year old female with radial head fracture.  She's been doing well with no problems.  She is ready to go back to work.  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  She 6 weeks out from injury.  On exam she has full active and passive range of motion of the elbow with no extension lag.  Pronation supination is full without pain or tenderness.  Plan is to return to full activity except for been compressing and pushups for 6 weeks after that activity as tolerated follow up with me as needed  Follow-Up Instructions: Return if symptoms worsen or fail to improve.   Orders:  No orders of the defined types were placed in this encounter.  No orders of the defined types were placed in this encounter.   Imaging: No results found.  PMFS History: Patient Active Problem List   Diagnosis Date Noted  . Radial head fracture 08/01/2017  . Common migraine 04/05/2017  . Migraine 10/08/2014   Past Medical History:  Diagnosis Date  . Anxiety   . Depression   . Headache   . Medical history non-contributory   . Migraine 10/08/2014  . SVD (spontaneous vaginal delivery)    x 1    Family History  Problem Relation Age of Onset  . Diabetes Mellitus I Mother   . Cancer - Other Mother        breast  . Bipolar disorder Brother   . Migraines Maternal Grandmother   . Melanoma Paternal Grandfather   . Cancer - Other Paternal Grandfather        throat    Past Surgical History:  Procedure Laterality Date  . CESAREAN SECTION WITH BILATERAL TUBAL LIGATION Bilateral 03/05/2013   Procedure: CESAREAN SECTION WITH BILATERAL TUBAL LIGATION;  Surgeon: Meriel Picaichard M Holland, MD;  Location:  [facility];  Service: Obstetrics;  Laterality: Bilateral;  Primary edc 03/09/13  . DILATION AND CURETTAGE OF UTERUS    . WISDOM TOOTH EXTRACTION     Social History   Occupational History  .  [employer]   Social History Main Topics  . Smoking status: Never Smoker  . Smokeless tobacco: Never Used  . Alcohol use Yes     Comment: one drink weekly  . Drug use: No  . Sexual activity: Yes    Birth control/ protection: None     Comment: pregnant

## 2017-10-09 NOTE — Progress Notes (Signed)
[facility]  PATIENT: [name] DOB: January 30, 1981   REASON FOR VISIT: Follow-up for headache/migraine HISTORY FROM: Patient    HISTORY OF PRESENT ILLNESS:[name] is a 36 year old right-handed white female with a history of migraine headaches. Her migraine triggers are her menstrual cycle and lack of sleep and weather changes. She has stopped her Topamax since last seen due to word finding problems. This has improved since being off the medication. Her headaches have not worsened . She does not want to be  on another preventive at this time. Maxalt continues to work acutely. She also complains of occasional neck tension and  the neck exercises that were given to her have helped. 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 She returns for reevaluation.   REVIEW OF SYSTEMS: Full 14 system review of systems performed and notable only for those listed, all others are neg:  Constitutional: neg  Cardiovascular: neg Ear/Nose/Throat: neg  Skin: neg Eyes: neg Respiratory: neg Gastroitestinal: neg  Hematology/Lymphatic: neg  Endocrine: neg Musculoskeletal:neg Allergy/Immunology: neg Neurological: Headaches Psychiatric: neg Sleep : neg   ALLERGIES: Allergies  Allergen Reactions  . Latex Rash    HOME MEDICATIONS: Outpatient Medications Prior to Visit  Medication Sig Dispense Refill  . Desvenlafaxine Succinate ER 25 MG TB24 Take 25 mg by mouth daily.    Marland Kitchen ibuprofen (ADVIL,MOTRIN) 800 MG tablet Take 1 tablet (800 mg total) by mouth 3 (three) times daily. (Patient taking differently: Take 800 mg by mouth 3 (three) times daily as needed. ) 21 tablet 0  . rizatriptan (MAXALT) 10 MG tablet Take 1 tablet (10 mg total) by mouth as needed for migraine. May repeat in 2 hours if needed 12 tablet 6  . HYDROcodone-acetaminophen (NORCO) 5-325 MG tablet Take 1 tablet by mouth every 6 (six) hours as needed for severe pain (May cause constipation). For severe pain  only. Do not mix with alcohol, benzodiazepines, muscle relaxer. No refills without office visit. 12 tablet 0  . topiramate (TOPAMAX) 50 MG tablet Take 2 tablets (100 mg total) by mouth daily. (Patient not taking: Reported on 10/10/2017) 60 tablet 6   No facility-administered medications prior to visit.     PAST MEDICAL HISTORY: Past Medical History:  Diagnosis Date  . Anxiety   . Broken arm   . Depression   . Headache   . Medical history non-contributory   . Migraine 10/08/2014  . SVD (spontaneous vaginal delivery)    x 1    PAST SURGICAL HISTORY: Past Surgical History:  Procedure Laterality Date  . CESAREAN SECTION WITH BILATERAL TUBAL LIGATION Bilateral 03/05/2013   Procedure: CESAREAN SECTION WITH BILATERAL TUBAL LIGATION;  Surgeon: Meriel Pica, MD;  Location: [facility];  Service: Obstetrics;  Laterality: Bilateral;  Primary edc 03/09/13  . DILATION AND CURETTAGE OF UTERUS    . WISDOM TOOTH EXTRACTION      FAMILY HISTORY: Family History  Problem Relation Age of Onset  . Diabetes Mellitus I Mother   . Cancer - Other Mother        breast  . Bipolar disorder Brother   . Migraines Maternal Grandmother   . Melanoma Paternal Grandfather   . Cancer - Other Paternal Grandfather        throat    SOCIAL HISTORY: Social History   Social History  . Marital status: Married    Spouse name: N/A  . Number of children: 2  . Years of education: N/A   Occupational  History  .  [employer]   Social History Main Topics  . Smoking status: Never Smoker  . Smokeless tobacco: Never Used  . Alcohol use Yes     Comment: one drink weekly  . Drug use: No  . Sexual activity: Yes    Birth control/ protection: None     Comment: pregnant   Other Topics Concern  . Not on file   Social History Narrative   Patient is right handed   Patient drinks about 2 cups caffeine daily.     PHYSICAL EXAM  Vitals:   10/10/17 1513  BP: 114/71  Pulse: 66  Weight: 121 lb 9.6  oz (55.2 kg)  Height:  (1.626 m)   Body mass index is 20.87 kg/m. Generalized: Well developed, in no acute distress  Head: normocephalic and atraumatic,. Oropharynx benign  Neck: Supple, paraspinals tender to palpation Musculoskeletal: No deformity   Neurological examination   Mentation: Alert oriented to time, place, history taking. Attention span and concentration appropriate. Recent and remote memory intact. Follows all commands speech and language fluent.   Cranial nerve II-XII: Pupils were equal round reactive to light extraocular movements were full, visual field were full on confrontational test. Facial sensation and strength were normal. hearing was intact to finger rubbing bilaterally. Uvula tongue midline. head turning and shoulder shrug were normal and symmetric.Tongue protrusion into cheek strength was normal. Motor: normal bulk and tone, full strength in the BUE, BLE,  Sensory: normal and symmetric to light touch, pinprick, and Vibration in the upper and lower extremities,  Coordination: finger-nose-finger, heel-to-shin bilaterally, no dysmetria Reflexes: Brachioradialis 2/2, biceps 2/2, triceps 2/2, patellar 2/2, Achilles 2/2, plantar responses were flexor bilaterally. Gait and Station: Rising up from seated position without assistance, normal stance, moderate stride, good arm swing, smooth turning, able to perform tiptoe, and heel walking without difficulty. Tandem gait is steady DIAGNOSTIC DATA (LABS, IMAGING, TESTING) - ASSESSMENT AND PLAN 36 y.o. year old female has a past medical history of Migraine (10/08/2014). here to follow-up.  Weather changes, stress and her menstrual cycle are triggers. Complains of tenderness in her paraspinals.She has stopped Topamax due to word finding difficulty.   PLAN: Continue Maxalt at current dose will refill Continue neck exercises for tension in paraspinals  Patient does not want to be placed on additional  preventive  medication for headaches  at this time If headaches worsen record on Migraine tracker APP Follow-up in 6  Months I spent 15 min  in total face to face time with the patient more than 50% of which was spent counseling and coordination of care, reviewing test results reviewing medications and discussing and reviewing the diagnosis of migraine and tenderness in the paraspinals. Since she does not wish to be placed on meds encouraged continuing with  neck exercises.  Nilda Riggs, [facility], [facility], APRN  [facility] [address], [address] [place], [place] [address] [phone]

## 2017-10-10 ENCOUNTER — Encounter: Payer: Self-pay | Admitting: Nurse Practitioner

## 2017-10-10 ENCOUNTER — Ambulatory Visit (INDEPENDENT_AMBULATORY_CARE_PROVIDER_SITE_OTHER): Payer: BC Managed Care – PPO | Admitting: Nurse Practitioner

## 2017-10-10 ENCOUNTER — Encounter (INDEPENDENT_AMBULATORY_CARE_PROVIDER_SITE_OTHER): Payer: Self-pay

## 2017-10-10 VITALS — BP 114/71 | HR 66 | Ht 64.0 in | Wt 121.6 lb

## 2017-10-10 DIAGNOSIS — G43009 Migraine without aura, not intractable, without status migrainosus: Secondary | ICD-10-CM

## 2017-10-10 MED ORDER — RIZATRIPTAN BENZOATE 10 MG PO TABS: 10.0000 mg | ORAL_TABLET | ORAL | 1 refills | Status: DC | PRN

## 2017-10-10 NOTE — Progress Notes (Signed)
I have read the note, and I agree with the clinical assessment and plan.  Lyllian Gause KEITH   

## 2017-10-10 NOTE — Patient Instructions (Signed)
Continue Maxalt at current dose will refill Continue neck exercises for tension in paraspinals  Patient does not want to be placed on additional  preventive medication for headaches  at this time If headaches worsen record on Migraine tracker APP Follow-up in 6  Months

## 2017-11-22 ENCOUNTER — Telehealth: Payer: Self-pay | Admitting: Nurse Practitioner

## 2017-11-22 NOTE — Telephone Encounter (Addendum)
Called patient to discuss Topamax. Advised that she had stopped it due to word finding difficulty. Patient stated that she had, but she is experiencing increase in migraines. She stated she wants a preventative medication. She has averaged one migraine per week, but had 4 in the past 2 weeks.  Two of the migraines caused her to miss work. She cannot recall what other migraines medications she has tried/failed in the past. This RN advised will route her request to Eber Jonesarolyn, NP and call her with reply. Patient verbalized understanding, appreciation.

## 2017-11-22 NOTE — Telephone Encounter (Signed)
Patient is calling. She has had 4 migraines in the last week and a half. At last office visit with Eber Jonesarolyn on 10-10-17 she had stopped taking Topamax because her headaches had decreased. Since headaches are increasing she would like a Rx for Topamax called to [facility] if Eber JonesCarolyn thinks that is what she needs to do.

## 2017-11-23 MED ORDER — NORTRIPTYLINE HCL 10 MG PO CAPS: ORAL_CAPSULE | ORAL | 5 refills | Status: DC

## 2017-11-23 NOTE — Telephone Encounter (Signed)
LVM informing patient that [name] ordered nortriptyline, Rx sent to pharmacy. Gave her Rx instructions per NP. advised she continue taking Maxalt as needed.  Advised she call back for any questions.

## 2017-11-23 NOTE — Telephone Encounter (Signed)
Since patient has had problems with Topamax with word finding we will try low-dose nortriptyline at bedtime.  This will be called to her local pharmacy to take as directed.  She is taking Maxalt acutely as ordered.  I assume.

## 2017-11-23 NOTE — Addendum Note (Signed)
Addended by: Lynder ParentsMARTIN, NANCY C on: 11/23/2017 08:05 AM   Modules accepted: Orders

## 2018-02-03 ENCOUNTER — Ambulatory Visit: Payer: BC Managed Care – PPO | Admitting: Neurology

## 2018-02-03 ENCOUNTER — Encounter: Payer: Self-pay | Admitting: Neurology

## 2018-02-03 VITALS — BP 100/70 | HR 92 | Ht 64.0 in | Wt 120.0 lb

## 2018-02-03 DIAGNOSIS — G43009 Migraine without aura, not intractable, without status migrainosus: Secondary | ICD-10-CM | POA: Diagnosis not present

## 2018-02-03 MED ORDER — ZONISAMIDE 25 MG PO CAPS: 50.0000 mg | ORAL_CAPSULE | Freq: Every day | ORAL | 3 refills | Status: DC

## 2018-02-03 NOTE — Progress Notes (Signed)
Reason for visit: Migraine headache  [name] is an 37 y.o. female  History of present illness:  [name] is a 37 year old right-handed white female with a history of intractable migraine headache.  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  She had come off of Topamax secondary to cognitive side effects although she had been on the medication for a number of years.  She was on 100 mg daily.  The patient has now had almost daily headaches, she may have 12 days of the month where the headache is quite severe.  The patient may have an aura to the headache with some visual disturbances with squiggly lines that last about 20 minutes prior to the onset of the headache.  She takes Maxalt if needed.  The patient returns to the office today for an evaluation.  In the past, weather changes, bright lights and odors may initiate headache.  Past Medical History:  Diagnosis Date  . Anxiety   . Broken arm   . Depression   . Headache   . Medical history non-contributory   . Migraine 10/08/2014  . SVD (spontaneous vaginal delivery)    x 1    Past Surgical History:  Procedure Laterality Date  . CESAREAN SECTION WITH BILATERAL TUBAL LIGATION Bilateral 03/05/2013   Procedure: CESAREAN SECTION WITH BILATERAL TUBAL LIGATION;  Surgeon: Meriel Pica, MD;  Location: [facility];  Service: Obstetrics;  Laterality: Bilateral;  Primary edc 03/09/13  . DILATION AND CURETTAGE OF UTERUS    . WISDOM TOOTH EXTRACTION      Family History  Problem Relation Age of Onset  . Diabetes Mellitus I Mother   . Cancer - Other Mother        breast  . Bipolar disorder Brother   . Migraines Maternal Grandmother   . Melanoma Paternal Grandfather   . Cancer - Other Paternal Grandfather        throat    Social history:  reports that  has never smoked. she has never used smokeless tobacco. She reports that she drinks alcohol. She reports that she does not use  drugs.    Allergies  Allergen Reactions  . Latex Rash    Medications:  Prior to Admission medications   Medication Sig Start Date End Date Taking? Authorizing Provider  Desvenlafaxine Succinate ER 25 MG TB24 Take 50 mg by mouth daily.  04/04/17  Yes [provider]  nortriptyline (PAMELOR) 10 MG capsule 1 p.o. at bedtime for 1 week then increase to 2 at bedtime Patient taking differently: Take 20 mg by mouth at bedtime. 1 p.o. at bedtime for 1 week then increase to 2 at bedtime  11/23/17  Yes Nilda Riggs, NP  rizatriptan (MAXALT) 10 MG tablet Take 1 tablet (10 mg total) by mouth as needed for migraine. May repeat in 2 hours if needed 10/10/17  Yes Nilda Riggs, NP  zonisamide (ZONEGRAN) 25 MG capsule Take 2 capsules (50 mg total) by mouth at bedtime. 02/03/18   York Spaniel, MD    ROS:  Out of a complete 14 system review of symptoms, the patient complains only of the following symptoms, and all other reviewed systems are negative.  Headache  Blood pressure 100/70, pulse 92, height 5\' 4"  (1.626 m), weight 120 lb (54.4 kg).  Physical Exam  General: The patient is alert and cooperative at the time of the examination.  Skin: No significant peripheral  edema is noted.   Neurologic Exam  Mental status: The patient is alert and oriented x 3 at the time of the examination. The patient has apparent normal recent and remote memory, with an apparently normal attention span and concentration ability.   Cranial nerves: Facial symmetry is present. Speech is normal, no aphasia or dysarthria is noted. Extraocular movements are full. Visual fields are full.  Motor: The patient has good strength in all 4 extremities.  Sensory examination: Soft touch sensation is symmetric on the face, arms, and legs.  Coordination: The patient has good finger-nose-finger and heel-to-shin bilaterally.  Gait and station: The patient has a normal gait. Tandem gait is normal.  Romberg is negative. No drift is seen.  Reflexes: Deep tendon reflexes are symmetric.   Assessment/Plan:  1.  Intractable migraine headache  The patient currently is not doing well at all with her migraine frequency.  Topamax worked quite well for her previously.  The patient will stop the nortriptyline.  She will go on Zonegran starting at 25 mg at night for 1 week and then go to 50 mg at night, she will call for any dose adjustments.  The patient may be considered for an extended release preparation of Topamax in the future if the Zonegran is not tolerated.  The patient will follow-up in 4 months.  The use of Aimovig or other related medications may be used as well in the future.  Marlan Palau. Keith Teia Freitas MD 02/03/2018 9:25 AM  [facility] [address] [place], [place] [id number]  Phone [phone] Fax [phone]

## 2018-02-03 NOTE — Patient Instructions (Signed)
   Stop the nortriptyline and start zonegran 25 mg at night for one week, then take 2 at night, call for any dose adjustments.

## 2018-02-24 ENCOUNTER — Other Ambulatory Visit: Payer: Self-pay | Admitting: Obstetrics and Gynecology

## 2018-02-24 DIAGNOSIS — Z803 Family history of malignant neoplasm of breast: Secondary | ICD-10-CM

## 2018-03-08 ENCOUNTER — Ambulatory Visit
Admission: RE | Admit: 2018-03-08 | Discharge: 2018-03-08 | Disposition: A | Discharge status: Home / Self Care | Payer: BC Managed Care – PPO | Source: Ambulatory Visit | Attending: Obstetrics and Gynecology | Admitting: Obstetrics and Gynecology

## 2018-03-08 DIAGNOSIS — Z803 Family history of malignant neoplasm of breast: Secondary | ICD-10-CM

## 2018-03-08 MED ORDER — GADOBENATE DIMEGLUMINE 529 MG/ML IV SOLN
12.0000 mL | Freq: Once | INTRAVENOUS | Status: AC | PRN
  Administered 2018-03-08: 12 mL via INTRAVENOUS

## 2018-04-17 ENCOUNTER — Ambulatory Visit: Payer: BC Managed Care – PPO | Admitting: Nurse Practitioner

## 2018-06-02 ENCOUNTER — Other Ambulatory Visit: Payer: Self-pay | Admitting: Neurology

## 2018-06-07 NOTE — Progress Notes (Signed)
[facility]  PATIENT: [name] DOB: December 26, 1981   REASON FOR VISIT: Follow-up for headache/migraine HISTORY FROM: Patient    HISTORY OF PRESENT ILLNESS:UPDATE 6/13/2019CM Ms. [name], 37 year old female returns for follow-up with history of migraine headaches.  Her last seen by Dr. Anne Hahn she had discontinued her Topamax secondary to cognitive side effects and was having daily headaches.  He placed her on Zonegran 50 mg at bedtime.  She has responded well.  She continues Maxalt acutely.  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  Weather changes bright lights odors can initiate her headaches as well as her cycle.  She returns for reevaluation   02/03/18 KW  Ms. [name] is a 37 year old right-handed white female with a history of intractable migraine headache.  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  She had come off of Topamax secondary to cognitive side effects although she had been on the medication for a number of years.  She was on 100 mg daily.  The patient has now had almost daily headaches, she may have 12 days of the month where the headache is quite severe.  The patient may have an aura to the headache with some visual disturbances with squiggly lines that last about 20 minutes prior to the onset of the headache.  She takes Maxalt if needed.  The patient returns to the office today for an evaluation.  In the past, weather changes, bright lights and odors may initiate headache.     Ms. [name] is a 37 year old right-handed white female with a history of migraine headaches. Her migraine triggers are her menstrual cycle and lack of sleep and weather changes. She has stopped her Topamax since last seen due to word finding problems. This has improved since being off the medication. Her headaches have not worsened . She does not want to be  on another preventive at this time. Maxalt continues to work  acutely. She also complains of occasional neck tension and  the neck exercises that were given to her have helped. 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 She returns for reevaluation.   REVIEW OF SYSTEMS: Full 14 system review of systems performed and notable only for those listed, all others are neg:  Constitutional: neg  Cardiovascular: neg Ear/Nose/Throat: neg  Skin: neg Eyes: neg Respiratory: neg Gastroitestinal: neg  Hematology/Lymphatic: neg  Endocrine: neg Musculoskeletal:neg Allergy/Immunology: neg Neurological: Headaches Psychiatric: neg Sleep : neg   ALLERGIES: Allergies  Allergen Reactions  . Latex Rash    HOME MEDICATIONS: Outpatient Medications Prior to Visit  Medication Sig Dispense Refill  . Desvenlafaxine Succinate ER 25 MG TB24 Take 50 mg by mouth daily.     . rizatriptan (MAXALT) 10 MG tablet Take 1 tablet (10 mg total) by mouth as needed for migraine. May repeat in 2 hours if needed 36 tablet 1  . zonisamide (ZONEGRAN) 25 MG capsule Take 2 capsules (50 mg total) by mouth at bedtime. 60 capsule 3  . nortriptyline (PAMELOR) 10 MG capsule 1 p.o. at bedtime for 1 week then increase to 2 at bedtime (Patient taking differently: Take 20 mg by mouth at bedtime. 1 p.o. at bedtime for 1 week then increase to 2 at bedtime ) 60 capsule 5   No facility-administered medications prior to visit.     PAST MEDICAL HISTORY: Past Medical History:  Diagnosis Date  . Anxiety   .  Broken arm   . Depression   . Headache   . Medical history non-contributory   . Migraine 10/08/2014  . SVD (spontaneous vaginal delivery)    x 1    PAST SURGICAL HISTORY: Past Surgical History:  Procedure Laterality Date  . CESAREAN SECTION WITH BILATERAL TUBAL LIGATION Bilateral 03/05/2013   Procedure: CESAREAN SECTION WITH BILATERAL TUBAL LIGATION;  Surgeon: Meriel Picaichard M Holland, MD;  Location: [facility];  Service: Obstetrics;  Laterality: Bilateral;  Primary edc 03/09/13  .  DILATION AND CURETTAGE OF UTERUS    . WISDOM TOOTH EXTRACTION      FAMILY HISTORY: Family History  Problem Relation Age of Onset  . Diabetes Mellitus I Mother   . Cancer - Other Mother        breast  . Bipolar disorder Brother   . Migraines Maternal Grandmother   . Melanoma Paternal Grandfather   . Cancer - Other Paternal Grandfather        throat    SOCIAL HISTORY: Social History   Socioeconomic History  . Marital status: Married    Spouse name: Not on file  . Number of children: 2  . Years of education: Not on file  . Highest education level: Not on file  Occupational History    Employer: [employer]  Social Needs  . Financial resource strain: Not on file  . Food insecurity:    Worry: Not on file    Inability: Not on file  . Transportation needs:    Medical: Not on file    Non-medical: Not on file  Tobacco Use  . Smoking status: Never Smoker  . Smokeless tobacco: Never Used  Substance and Sexual Activity  . Alcohol use: Yes    Comment: one drink weekly  . Drug use: No  . Sexual activity: Yes    Birth control/protection: None    Comment: pregnant  Lifestyle  . Physical activity:    Days per week: Not on file    Minutes per session: Not on file  . Stress: Not on file  Relationships  . Social connections:    Talks on phone: Not on file    Gets together: Not on file    Attends religious service: Not on file    Active member of club or organization: Not on file    Attends meetings of clubs or organizations: Not on file    Relationship status: Not on file  . Intimate partner violence:    Fear of current or ex partner: Not on file    Emotionally abused: Not on file    Physically abused: Not on file    Forced sexual activity: Not on file  Other Topics Concern  . Not on file  Social History Narrative   Patient is right handed   Patient drinks about 2 cups caffeine daily.     PHYSICAL EXAM  Vitals:   06/08/18 1024  BP: 101/68  Pulse: 85   Weight: 119 lb 6.4 oz (54.2 kg)  Height: 5\' 4"  (1.626 m)   Body mass index is 20.49 kg/m. Generalized: Well developed, in no acute distress  Head: normocephalic and atraumatic,. Oropharynx benign  Neck: Supple,  Musculoskeletal: No deformity   Neurological examination   Mentation: Alert oriented to time, place, history taking. Attention span and concentration appropriate. Recent and remote memory intact. Follows all commands speech and language fluent.   Cranial nerve II-XII: Pupils were equal round reactive to light extraocular movements were full, visual field were  full on confrontational test. Facial sensation and strength were normal. hearing was intact to finger rubbing bilaterally. Uvula tongue midline. head turning and shoulder shrug were normal and symmetric.Tongue protrusion into cheek strength was normal. Motor: normal bulk and tone, full strength in the BUE, BLE,  Sensory: normal and symmetric to light touch,  Coordination: finger-nose-finger, heel-to-shin bilaterally, no dysmetria Reflexes: Symmetric upper and lower plantar responses were flexor bilaterally. Gait and Station: Rising up from seated position without assistance, normal stance, moderate stride, good arm swing, smooth turning, able to perform tiptoe, and heel walking without difficulty. Tandem gait is steady DIAGNOSTIC DATA (LABS, IMAGING, TESTING) - ASSESSMENT AND PLAN 37 y.o. year old female has a past medical history of Migraine (10/08/2014). here to follow-up.  Weather changes, stress and her menstrual cycle are triggers. She Topamax due to word finding difficulty.  She has also failed nortriptyline   She was placed on Zonegran by Dr. Anne Hahn and has done well.  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   PLAN: Continue Maxalt at current dose will refill Continue Zonegran at current dose will refill  If headaches worsen record on Migraine tracker APP Follow-up in 6  Months I spent 15 min  in  total face to face time with the patient more than 50% of which was spent counseling and coordination of care, reviewing test results reviewing medications and discussing and reviewing the diagnosis of migraine and tenderness in the paraspinals. Since she does not wish to be placed on meds encouraged continuing with  neck exercises.  Nilda Riggs, [facility], [facility], APRN  [facility] [address] [place], [place] [address] [phone]

## 2018-06-08 ENCOUNTER — Ambulatory Visit: Payer: BC Managed Care – PPO | Admitting: Nurse Practitioner

## 2018-06-08 ENCOUNTER — Encounter: Payer: Self-pay | Admitting: Nurse Practitioner

## 2018-06-08 VITALS — BP 101/68 | HR 85 | Ht 64.0 in | Wt 119.4 lb

## 2018-06-08 DIAGNOSIS — G43009 Migraine without aura, not intractable, without status migrainosus: Secondary | ICD-10-CM

## 2018-06-08 MED ORDER — RIZATRIPTAN BENZOATE 10 MG PO TABS: 10.0000 mg | ORAL_TABLET | ORAL | 1 refills | Status: DC | PRN

## 2018-06-08 MED ORDER — ZONISAMIDE 25 MG PO CAPS: 50.0000 mg | ORAL_CAPSULE | Freq: Every day | ORAL | 1 refills | Status: DC

## 2018-06-08 NOTE — Patient Instructions (Signed)
Continue Maxalt at current dose will refill Continue Zonegran at current dose will refill  If headaches worsen record on Migraine tracker APP Follow-up in 6  Months

## 2018-06-08 NOTE — Progress Notes (Signed)
I have read the note, and I agree with the clinical assessment and plan.  Charles K [name]   

## 2018-06-28 DIAGNOSIS — F419 Anxiety disorder, unspecified: Secondary | ICD-10-CM | POA: Insufficient documentation

## 2018-06-28 DIAGNOSIS — F32A Depression, unspecified: Secondary | ICD-10-CM | POA: Insufficient documentation

## 2018-12-01 ENCOUNTER — Other Ambulatory Visit: Payer: Self-pay | Admitting: Nurse Practitioner

## 2018-12-12 ENCOUNTER — Telehealth: Payer: Self-pay | Admitting: Nurse Practitioner

## 2018-12-12 NOTE — Telephone Encounter (Signed)
Pt requesting refills for zonisamide (ZONEGRAN) 25 MG capsule to last her until her upcoming appt on 1/9 sent to [facility]

## 2018-12-12 NOTE — Telephone Encounter (Signed)
Spoke to pt and relayed that zonegran was escribed to [facility] [address] for [facility] and on RF.  She will call pharmacy.

## 2019-01-02 NOTE — Progress Notes (Signed)
[facility]  PATIENT: [name] DOB: 11/11/1981   REASON FOR VISIT: Follow-up for headache/migraine HISTORY FROM: Patient    HISTORY OF PRESENT ILLNESS:UPDATE 1/9/2020CM [name], 38 year old female returns for follow-up with history of migraine headaches.  She is currently on Zonegran 50 mg at bedtime doing well when last seen however has had an increase in headaches the last 3 months.  She has missed 5 days of work.  She takes Maxalt acutely with relief.  Her headaches occur during her cycles weather changes and increased stress.  She continues to teach school.  She recently saw her primary care provider for pain in the left forearm.  She is on a prednisone six-day taper.  She returns for reevaluation  UPDATE 6/13/2019CM [name], 38 year old female returns for follow-up with history of migraine headaches.  Her last seen by Dr. Anne HahnWillis she had discontinued her Topamax secondary to cognitive side effects and was having daily headaches.  He placed her on Zonegran 50 mg at bedtime.  She has responded well.  She continues Maxalt acutely.  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  Weather changes bright lights odors can initiate her headaches as well as her cycle.  She returns for reevaluation   02/03/18 KW  [name] is a 38 year old right-handed white female with a history of intractable migraine headache.  The patient works as a Runner, broadcasting/film/videoteacher, she has missed days of work because of the headache.  She had come off of Topamax secondary to cognitive side effects although she had been on the medication for a number of years.  She was on 100 mg daily.  The patient has now had almost daily headaches, she may have 12 days of the month where the headache is quite severe.  The patient may have an aura to the headache with some visual disturbances with squiggly lines that last about 20 minutes prior to the onset of the headache.  She takes Maxalt if needed.  The  patient returns to the office today for an evaluation.  In the past, weather changes, bright lights and odors may initiate headache.     [name] is a 38 year old right-handed white female with a history of migraine headaches. Her migraine triggers are her menstrual cycle and lack of sleep and weather changes. She has stopped her Topamax since last seen due to word finding problems. This has improved since being off the medication. Her headaches have not worsened . She does not want to be  on another preventive at this time. Maxalt continues to work acutely. She also complains of occasional neck tension and  the neck exercises that were given to her have helped. 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 She returns for reevaluation.   REVIEW OF SYSTEMS: Full 14 system review of systems performed and notable only for those listed, all others are neg:  Constitutional: neg  Cardiovascular: neg Ear/Nose/Throat: neg  Skin: neg Eyes: neg Respiratory: neg Gastroitestinal: neg  Hematology/Lymphatic: neg  Endocrine: neg Musculoskeletal:neg Allergy/Immunology: neg Neurological: Headaches Psychiatric: neg Sleep : neg   ALLERGIES: Allergies  Allergen Reactions  . Latex Rash    HOME MEDICATIONS: Outpatient Medications Prior to Visit  Medication Sig Dispense Refill  . Desvenlafaxine Succinate ER 25 MG TB24 Take 50 mg by mouth daily.     Marland Kitchen. MAGNESIUM CITRATE PO Take by mouth 2 (two) times daily.    . rizatriptan (MAXALT) 10 MG tablet Take  1 tablet (10 mg total) by mouth as needed for migraine. May repeat in 2 hours if needed 36 tablet 1  . zonisamide (ZONEGRAN) 100 MG capsule Take 100 mg by mouth daily.    Marland Kitchen zonisamide (ZONEGRAN) 25 MG capsule TAKE 2 CAPSULES (50 MG TOTAL) BY MOUTH AT BEDTIME. 180 capsule 1  . predniSONE (STERAPRED UNI-PAK 21 TAB) 10 MG (21) TBPK tablet Take by mouth. 01/04/19 Has not started     No facility-administered medications prior to visit.     PAST  MEDICAL HISTORY: Past Medical History:  Diagnosis Date  . Anxiety   . Broken arm   . Depression   . Headache   . Medical history non-contributory   . Migraine 10/08/2014  . SVD (spontaneous vaginal delivery)    x 1    PAST SURGICAL HISTORY: Past Surgical History:  Procedure Laterality Date  . CESAREAN SECTION WITH BILATERAL TUBAL LIGATION Bilateral 03/05/2013   Procedure: CESAREAN SECTION WITH BILATERAL TUBAL LIGATION;  Surgeon: Meriel Pica, MD;  Location: [facility];  Service: Obstetrics;  Laterality: Bilateral;  Primary edc 03/09/13  . DILATION AND CURETTAGE OF UTERUS    . WISDOM TOOTH EXTRACTION      FAMILY HISTORY: Family History  Problem Relation Age of Onset  . Diabetes Mellitus I Mother   . Cancer - Other Mother        breast  . Bipolar disorder Brother   . Migraines Maternal Grandmother   . Melanoma Paternal Grandfather   . Cancer - Other Paternal Grandfather        throat    SOCIAL HISTORY: Social History   Socioeconomic History  . Marital status: Married    Spouse name: Not on file  . Number of children: 2  . Years of education: Not on file  . Highest education level: Not on file  Occupational History    Employer: [employer]  Social Needs  . Financial resource strain: Not on file  . Food insecurity:    Worry: Not on file    Inability: Not on file  . Transportation needs:    Medical: Not on file    Non-medical: Not on file  Tobacco Use  . Smoking status: Never Smoker  . Smokeless tobacco: Never Used  Substance and Sexual Activity  . Alcohol use: Yes    Comment: one drink weekly  . Drug use: No  . Sexual activity: Yes    Birth control/protection: None    Comment: pregnant  Lifestyle  . Physical activity:    Days per week: Not on file    Minutes per session: Not on file  . Stress: Not on file  Relationships  . Social connections:    Talks on phone: Not on file    Gets together: Not on file    Attends religious service: Not  on file    Active member of club or organization: Not on file    Attends meetings of clubs or organizations: Not on file    Relationship status: Not on file  . Intimate partner violence:    Fear of current or ex partner: Not on file    Emotionally abused: Not on file    Physically abused: Not on file    Forced sexual activity: Not on file  Other Topics Concern  . Not on file  Social History Narrative   Patient is right handed   Patient drinks about 2 cups caffeine daily.     PHYSICAL EXAM  Vitals:   01/04/19 1514  BP: 105/76  Pulse: 89  Weight: 126 lb 3.2 oz (57.2 kg)  Height: 5\' 4"  (1.626 m)   Body mass index is 21.66 kg/m. Generalized: Well developed, in no acute distress  Head: normocephalic and atraumatic,. Oropharynx benign  Neck: Supple,  Musculoskeletal: No deformity   Neurological examination   Mentation: Alert oriented to time, place, history taking. Attention span and concentration appropriate. Recent and remote memory intact. Follows all commands speech and language fluent.   Cranial nerve II-XII: Pupils were equal round reactive to light extraocular movements were full, visual field were full on confrontational test. Facial sensation and strength were normal. hearing was intact to finger rubbing bilaterally. Uvula tongue midline. head turning and shoulder shrug were normal and symmetric.Tongue protrusion into cheek strength was normal. Motor: normal bulk and tone, full strength in the BUE, BLE,  Sensory: normal and symmetric to light touch,  Coordination: finger-nose-finger, heel-to-shin bilaterally, no dysmetria Reflexes: Symmetric upper and lower plantar responses were flexor bilaterally. Gait and Station: Rising up from seated position without assistance, normal stance, moderate stride, good arm swing, smooth turning, able to perform tiptoe, and heel walking without difficulty. Tandem gait is steady DIAGNOSTIC DATA (LABS, IMAGING, TESTING) - ASSESSMENT  AND PLAN 38 y.o. year old female has a past medical history of Migraine (10/08/2014). here to follow-up.  Weather changes, stress and her menstrual cycle are triggers. She had side effects to Topamax due to word finding difficulty.  She has also failed nortriptyline   She is currently on Zonegran 50 mg daily and she takes Maxalt acutely.  She has missed 5 days of work in the last 3 months.  She works as a Lexicographer: Continue Maxalt at current dose will refill Increase  Zonegran to 100mg  daily will refill  Call for worsening headaches Follow-up in 6  Months I spent  in total face to face time with the patient more than 50% of which was spent counseling and coordination of care, reviewing test results reviewing medications and discussing and reviewing the diagnosis of migraine.  Also discussed filling out FMLA.  Nilda Riggs, [facility], [facility], APRN  [facility] [address], [address] [place], [place] [address] [phone]

## 2019-01-04 ENCOUNTER — Ambulatory Visit: Payer: BC Managed Care – PPO | Admitting: Nurse Practitioner

## 2019-01-04 ENCOUNTER — Encounter: Payer: Self-pay | Admitting: Nurse Practitioner

## 2019-01-04 VITALS — BP 105/76 | HR 89 | Ht 64.0 in | Wt 126.2 lb

## 2019-01-04 DIAGNOSIS — G43009 Migraine without aura, not intractable, without status migrainosus: Secondary | ICD-10-CM | POA: Diagnosis not present

## 2019-01-04 MED ORDER — ZONISAMIDE 100 MG PO CAPS: 100.0000 mg | ORAL_CAPSULE | Freq: Every day | ORAL | 1 refills | Status: DC

## 2019-01-04 NOTE — Progress Notes (Signed)
I have read the note, and I agree with the clinical assessment and plan.  [name] [name]   

## 2019-01-04 NOTE — Patient Instructions (Signed)
Continue Maxalt at current dose will refill Increase  Zonegran to 100mg  daily will refill  Call for worsening headaches Follow-up in 6  Months

## 2019-03-05 ENCOUNTER — Other Ambulatory Visit: Payer: Self-pay | Admitting: Obstetrics and Gynecology

## 2019-03-05 DIAGNOSIS — R928 Other abnormal and inconclusive findings on diagnostic imaging of breast: Secondary | ICD-10-CM

## 2019-03-13 ENCOUNTER — Other Ambulatory Visit: Payer: Self-pay

## 2019-03-13 ENCOUNTER — Ambulatory Visit
Admission: RE | Admit: 2019-03-13 | Discharge: 2019-03-13 | Disposition: A | Discharge status: Home / Self Care | Payer: BC Managed Care – PPO | Source: Ambulatory Visit | Attending: Obstetrics and Gynecology | Admitting: Obstetrics and Gynecology

## 2019-03-13 ENCOUNTER — Ambulatory Visit: Payer: BC Managed Care – PPO

## 2019-03-13 DIAGNOSIS — R928 Other abnormal and inconclusive findings on diagnostic imaging of breast: Secondary | ICD-10-CM

## 2019-06-28 ENCOUNTER — Other Ambulatory Visit: Payer: Self-pay | Admitting: *Deleted

## 2019-06-28 MED ORDER — ZONISAMIDE 100 MG PO CAPS: 100.0000 mg | ORAL_CAPSULE | Freq: Every day | ORAL | 1 refills | Status: DC

## 2019-07-05 ENCOUNTER — Ambulatory Visit: Payer: BC Managed Care – PPO | Admitting: Neurology

## 2019-07-09 NOTE — Progress Notes (Signed)
PATIENT: [name] DOB: 08/23/1981  REASON FOR VISIT: follow up HISTORY FROM: patient  HISTORY OF PRESENT ILLNESS: Today 07/10/19  [name] is a 38 year old female with history of migraine headaches.  At last visit, Zonegran was increased to 100 mg daily, Maxalt as needed.  In the past, she could not tolerate Topamax or nortriptyline due to side effect. She knows she will get a migraine about 1 week before her period. She is getting migraine once a month. She does report some tension headache related to stress. The Maxalt will relieve the headache if she takes it early. Her overall health has been good since visit. 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  She presents today for follow-up unaccompanied.  HISTORY UPDATE 1/9/2020CM [name], 38 year old female returns for follow-up with history of migraine headaches.  She is currently on Zonegran 50 mg at bedtime doing well when last seen however has had an increase in headaches the last 3 months.  She has missed 5 days of work.  She takes Maxalt acutely with relief.  Her headaches occur during her cycles weather changes and increased stress.  She continues to teach school.  She recently saw her primary care provider for pain in the left forearm.  She is on a prednisone six-day taper.  She returns for reevaluation  REVIEW OF SYSTEMS: Out of a complete 14 system review of symptoms, the patient complains only of the following symptoms, and all other reviewed systems are negative.  Headache  ALLERGIES: Allergies  Allergen Reactions  . Latex Rash    HOME MEDICATIONS: Outpatient Medications Prior to Visit  Medication Sig Dispense Refill  . Desvenlafaxine Succinate ER 25 MG TB24 Take 50 mg by mouth daily.     . rizatriptan (MAXALT) 10 MG tablet Take 1 tablet (10 mg total) by mouth as needed for migraine. May repeat in 2 hours if needed 36 tablet 1  . zonisamide (ZONEGRAN) 100 MG capsule Take 1  capsule (100 mg total) by mouth daily. 90 capsule 1  . MAGNESIUM CITRATE PO Take by mouth 2 (two) times daily.    . predniSONE (STERAPRED UNI-PAK 21 TAB) 10 MG (21) TBPK tablet Take by mouth. 01/04/19 Has not started     No facility-administered medications prior to visit.     PAST MEDICAL HISTORY: Past Medical History:  Diagnosis Date  . Anxiety   . Broken arm   . Depression   . Headache   . Medical history non-contributory   . Migraine 10/08/2014  . SVD (spontaneous vaginal delivery)    x 1    PAST SURGICAL HISTORY: Past Surgical History:  Procedure Laterality Date  . CESAREAN SECTION WITH BILATERAL TUBAL LIGATION Bilateral 03/05/2013   Procedure: CESAREAN SECTION WITH BILATERAL TUBAL LIGATION;  Surgeon: Meriel Picaichard M Holland, MD;  Location: [facility];  Service: Obstetrics;  Laterality: Bilateral;  Primary edc 03/09/13  . DILATION AND CURETTAGE OF UTERUS    . WISDOM TOOTH EXTRACTION      FAMILY HISTORY: Family History  Problem Relation Age of Onset  . Diabetes Mellitus I Mother   . Cancer - Other Mother        breast  . Bipolar disorder Brother   . Migraines Maternal Grandmother   . Melanoma Paternal Grandfather   . Cancer - Other Paternal Grandfather        throat    SOCIAL HISTORY: Social History   Socioeconomic History  . Marital status: Married  Spouse name: Not on file  . Number of children: 2  . Years of education: Not on file  . Highest education level: Not on file  Occupational History    Employer: [employer]  Social Needs  . Financial resource strain: Not on file  . Food insecurity    Worry: Not on file    Inability: Not on file  . Transportation needs    Medical: Not on file    Non-medical: Not on file  Tobacco Use  . Smoking status: Never Smoker  . Smokeless tobacco: Never Used  Substance and Sexual Activity  . Alcohol use: Yes    Comment: one drink weekly  . Drug use: No  . Sexual activity: Yes    Birth control/protection: None     Comment: pregnant  Lifestyle  . Physical activity    Days per week: Not on file    Minutes per session: Not on file  . Stress: Not on file  Relationships  . Social Herbalist on phone: Not on file    Gets together: Not on file    Attends religious service: Not on file    Active member of club or organization: Not on file    Attends meetings of clubs or organizations: Not on file    Relationship status: Not on file  . Intimate partner violence    Fear of current or ex partner: Not on file    Emotionally abused: Not on file    Physically abused: Not on file    Forced sexual activity: Not on file  Other Topics Concern  . Not on file  Social History Narrative   Patient is right handed   Patient drinks about 2 cups caffeine daily.      PHYSICAL EXAM  Vitals:   07/10/19 1543  BP: 102/72  Pulse: 82  Temp: 98.6 F (37 C)  Weight: 124 lb 12.8 oz (56.6 kg)  Height: 5\' 3"  (1.6 m)   Body mass index is 22.11 kg/m.  Generalized: Well developed, in no acute distress   Neurological examination  Mentation: Alert oriented to time, place, history taking. Follows all commands speech and language fluent Cranial nerve II-XII: Pupils were equal round reactive to light. Extraocular movements were full, visual field were full on confrontational test. Facial sensation and strength were normal. Uvula tongue midline. Head turning and shoulder shrug  were normal and symmetric. Motor: The motor testing reveals 5 over 5 strength of all 4 extremities. Good symmetric motor tone is noted throughout.  Sensory: Sensory testing is intact to soft touch on all 4 extremities. No evidence of extinction is noted.  Coordination: Cerebellar testing reveals good finger-nose-finger and heel-to-shin bilaterally.  Gait and station: Gait is normal. Tandem gait is normal. Romberg is negative. No drift is seen.  Reflexes: Deep tendon reflexes are symmetric and normal bilaterally.   DIAGNOSTIC DATA  (LABS, IMAGING, TESTING) - I reviewed patient records, labs, notes, testing and imaging myself where available.  Lab Results  Component Value Date   WBC 12.8 (H) 03/06/2013   HGB 11.5 (L) 03/06/2013   HCT 34.6 (L) 03/06/2013   MCV 87.6 03/06/2013   PLT 198 03/06/2013   No results found for: NA, K, CL, CO2, GLUCOSE, BUN, CREATININE, CALCIUM, PROT, ALBUMIN, AST, ALT, ALKPHOS, BILITOT, GFRNONAA, GFRAA No results found for: CHOL, HDL, LDLCALC, LDLDIRECT, TRIG, CHOLHDL No results found for: HGBA1C No results found for: VITAMINB12 No results found for: TSH  ASSESSMENT AND PLAN 38 y.o. year old female  has a past medical history of Anxiety, Broken arm, Depression, Headache, Medical history non-contributory, Migraine (10/08/2014), and SVD (spontaneous vaginal delivery). here with:  1.  Migraine headache  She will continue taking Zonegran 100 mg daily, Maxalt as needed.  She does not need refills on Maxalt. She indicates good control of her migraine headaches, about 1/month.  She will follow-up in 8 months or sooner if needed.  I advised that if her symptoms worsen or she develops any new symptoms she should let us know.   I spent 15 minutes with the patient. 50% of this time was spent discussing her plan of care.   Margie EgeSarah Slack, AGNP-C, DNP 07/10/2019, 4:03 PM [facility] [address], [address] [place], [place] [address] [phone]

## 2019-07-10 ENCOUNTER — Ambulatory Visit: Payer: BC Managed Care – PPO | Admitting: Neurology

## 2019-07-10 ENCOUNTER — Other Ambulatory Visit: Payer: Self-pay

## 2019-07-10 ENCOUNTER — Encounter: Payer: Self-pay | Admitting: Neurology

## 2019-07-10 VITALS — BP 102/72 | HR 82 | Temp 98.6°F | Ht 63.0 in | Wt 124.8 lb

## 2019-07-10 DIAGNOSIS — G43009 Migraine without aura, not intractable, without status migrainosus: Secondary | ICD-10-CM | POA: Diagnosis not present

## 2019-07-10 MED ORDER — ZONISAMIDE 100 MG PO CAPS: 100.0000 mg | ORAL_CAPSULE | Freq: Every day | ORAL | 2 refills | Status: DC

## 2019-07-10 NOTE — Progress Notes (Signed)
I have read the note, and I agree with the clinical assessment and plan.  Rilynn Habel K Sharaine Delange   

## 2019-07-10 NOTE — Patient Instructions (Signed)
It was a pleasure to meet you! You look great!

## 2019-11-16 IMAGING — MG DIGITAL DIAGNOSTIC UNILATERAL LEFT MAMMOGRAM WITH TOMO AND CAD
6 series · 6 of 18 positions shown · non-contrast
Comparison: Previous exam(s).

CLINICAL DATA: 37-year-old female for further evaluation of
possible LEFT breast asymmetry on screening mammogram

EXAM:
DIGITAL DIAGNOSTIC UNILATERAL LEFT MAMMOGRAM WITH CAD AND TOMO

[L CC synth-2D (1 of 2)]
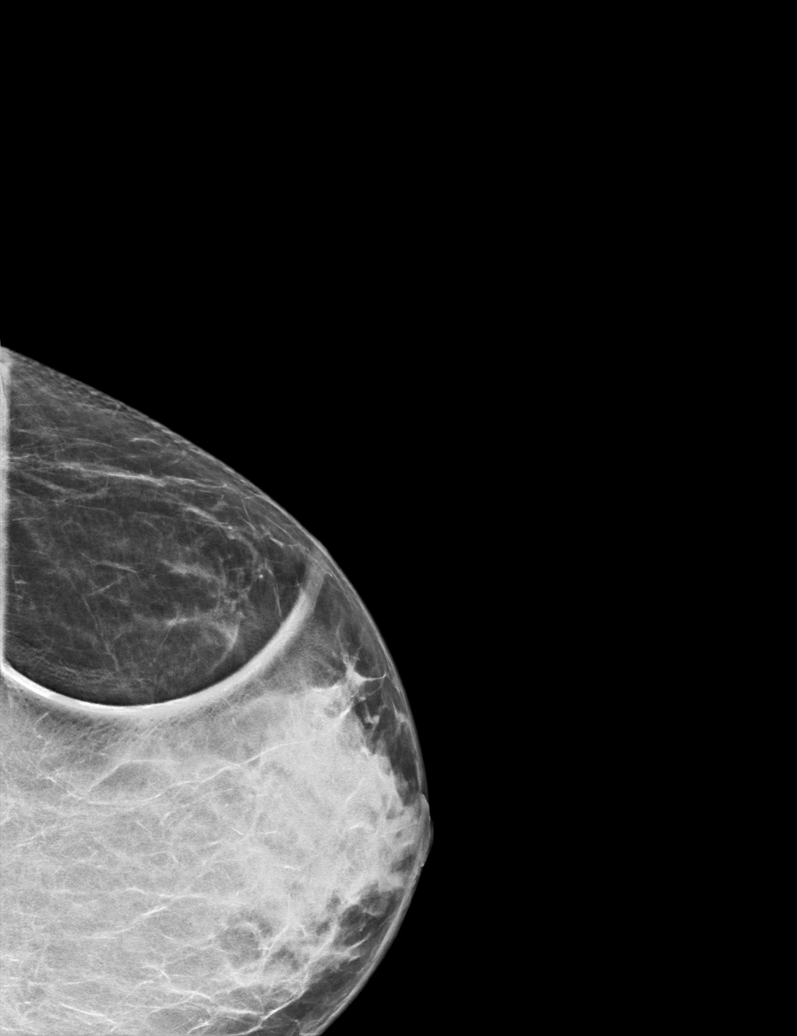

[L CC synth-2D (2 of 2)]
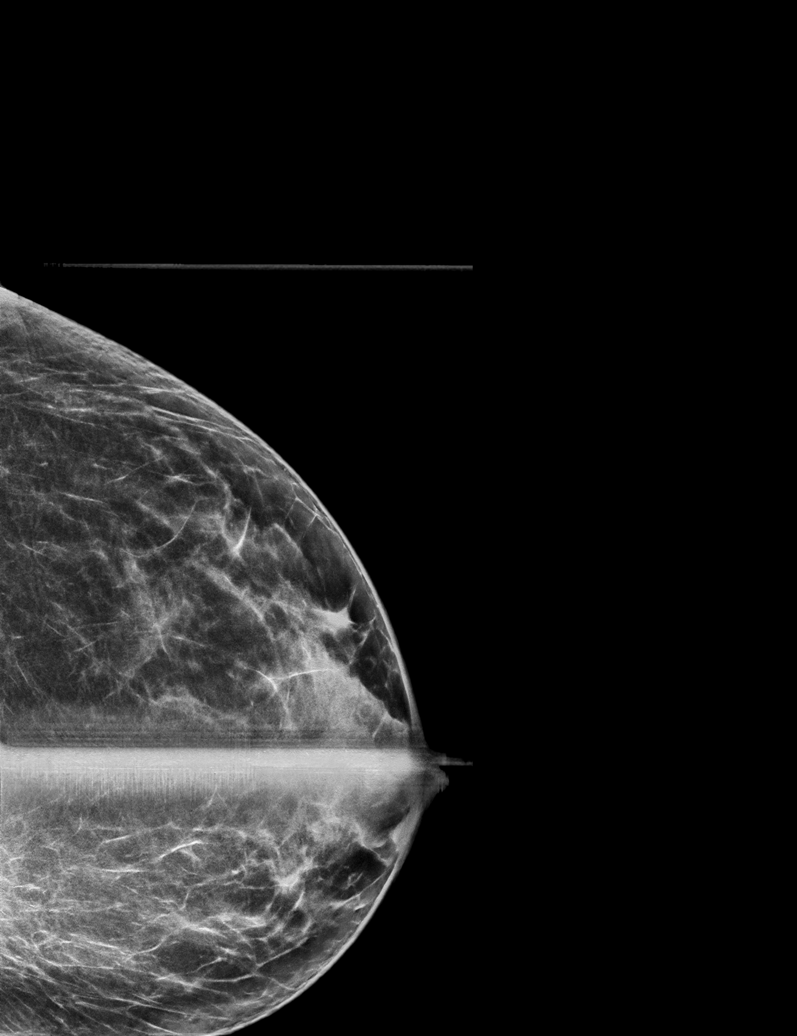

[L ML synth-2D]
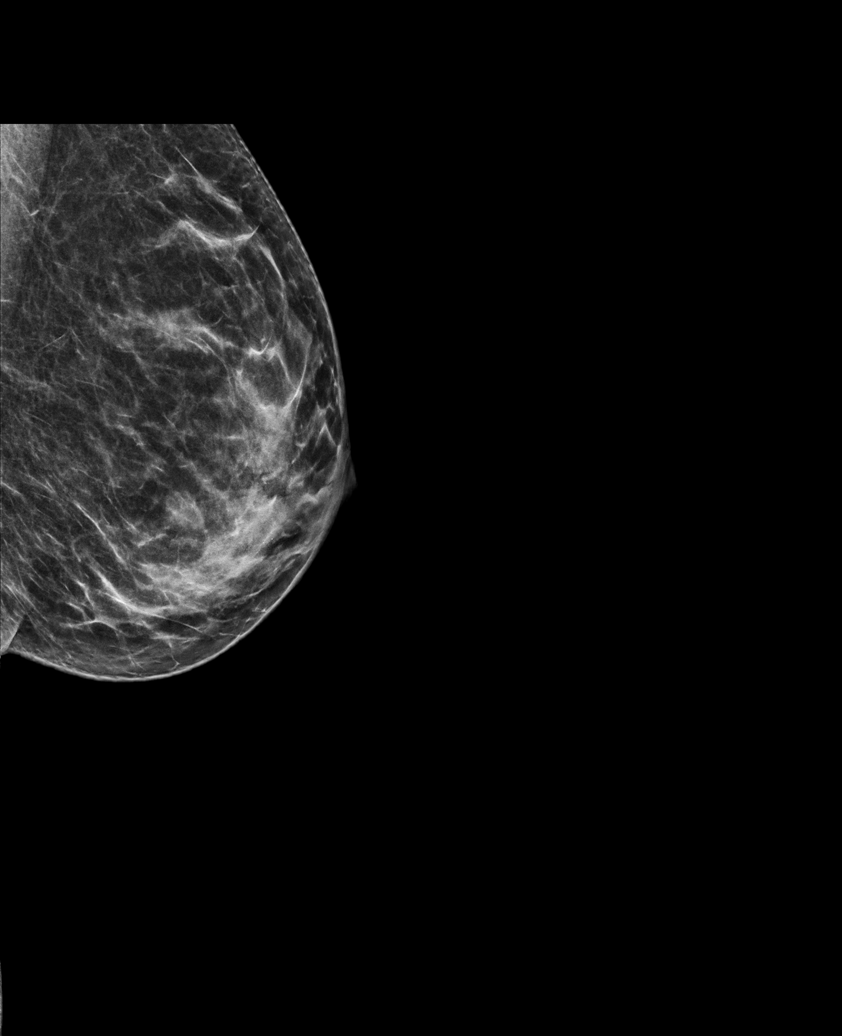

[L CC tomo (1 of 2) · tomo slice 29/58.0]
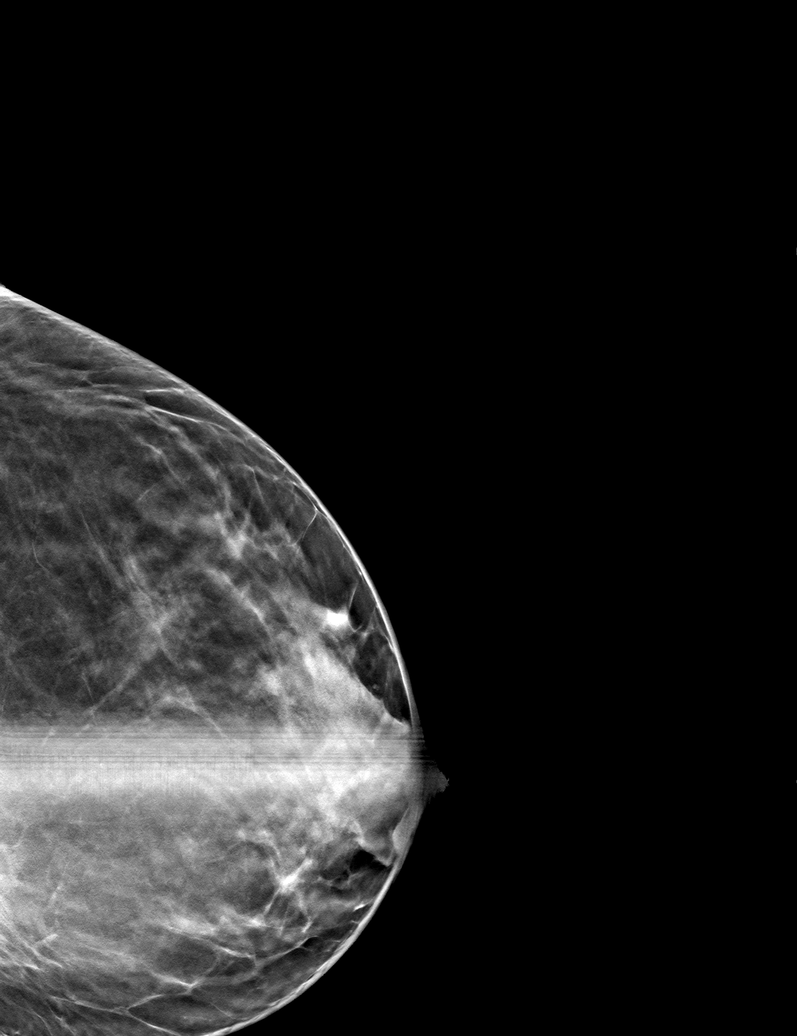

[L CC tomo (2 of 2) · tomo slice 27/54.0]
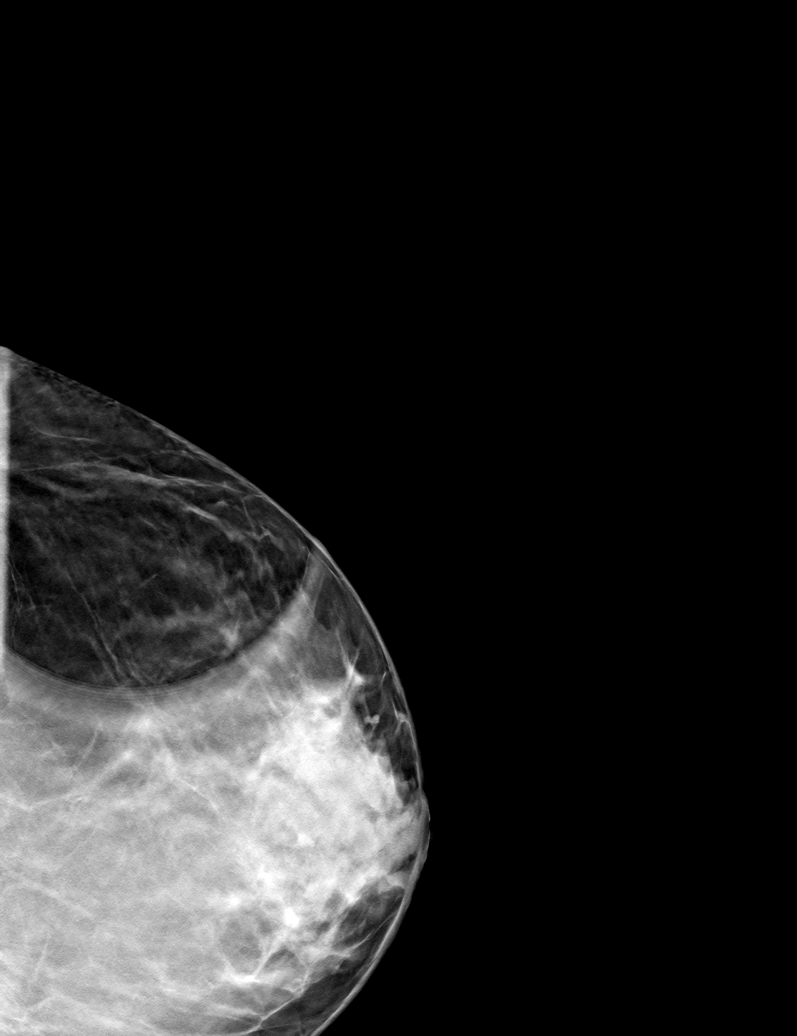

[L ML tomo · tomo slice 31/61.0]
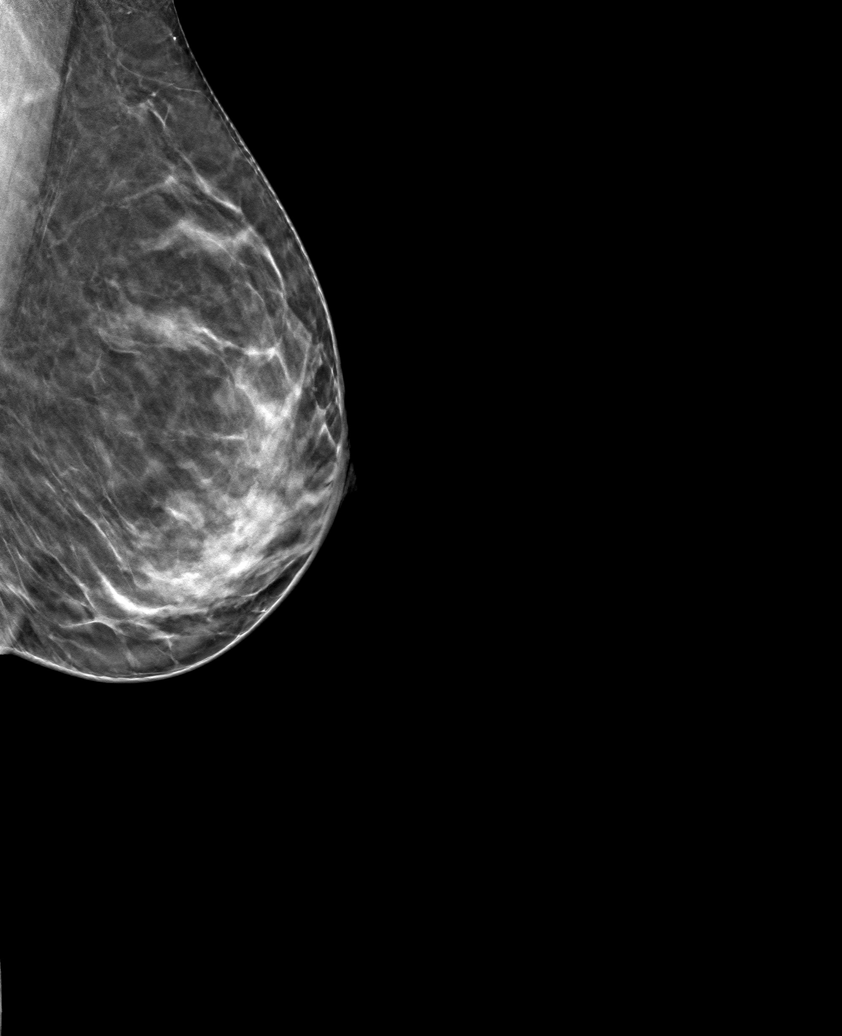

[6 of 18 positions shown; findings below may reference images not displayed]

ACR Breast Density Category b: There are scattered areas of
fibroglandular density.
FINDINGS: 2D/3D full field and spot compression views of the LEFT breast
demonstrate no persistent abnormality in the area of the screening
study finding.

Mammographic images were processed with CAD.
IMPRESSION: No persistent abnormality in the area of the screening study
finding.

RECOMMENDATION:
Bilateral screening mammogram in 1 year

I have discussed the findings and recommendations with the patient.
Results were also provided in writing at the conclusion of the
visit. If applicable, a reminder letter will be sent to the patient
regarding the next appointment.

BI-RADS CATEGORY  1: Negative.

## 2020-03-01 ENCOUNTER — Ambulatory Visit: Payer: BC Managed Care – PPO | Attending: Internal Medicine

## 2020-03-01 DIAGNOSIS — Z23 Encounter for immunization: Secondary | ICD-10-CM | POA: Insufficient documentation

## 2020-03-01 NOTE — Progress Notes (Signed)
   Covid-19 Vaccination Clinic  Name:  [name]    MRN: [id number] DOB: [birth year]  03/01/2020  [name] was observed post Covid-19 immunization for 15 minutes without incident. She was provided with Vaccine Information Sheet and instruction to access the V-Safe system.   [name] was instructed to call 911 with any severe reactions post vaccine: Marland Kitchen Difficulty breathing  . Swelling of face and throat  . A fast heartbeat  . A bad rash all over body  . Dizziness and weakness   Immunizations Administered    Name Date Dose VIS Date Route   Pfizer COVID-19 Vaccine 03/01/2020  8:36 AM 0.3 mL 12/07/2019 Intramuscular   Manufacturer: [employer], [employer]   Lot: [id number]   NDC: [address]-7

## 2020-03-11 ENCOUNTER — Encounter: Payer: Self-pay | Admitting: Neurology

## 2020-03-11 ENCOUNTER — Other Ambulatory Visit: Payer: Self-pay

## 2020-03-11 ENCOUNTER — Ambulatory Visit: Payer: BC Managed Care – PPO | Admitting: Neurology

## 2020-03-11 VITALS — BP 114/74 | HR 81 | Temp 97.0°F | Wt 128.0 lb

## 2020-03-11 DIAGNOSIS — G43009 Migraine without aura, not intractable, without status migrainosus: Secondary | ICD-10-CM | POA: Diagnosis not present

## 2020-03-11 MED ORDER — ZONISAMIDE 100 MG PO CAPS: 100.0000 mg | ORAL_CAPSULE | Freq: Every day | ORAL | 4 refills | Status: DC

## 2020-03-11 MED ORDER — RIZATRIPTAN BENZOATE 10 MG PO TABS: 10.0000 mg | ORAL_TABLET | ORAL | 3 refills | Status: DC | PRN

## 2020-03-11 NOTE — Progress Notes (Signed)
I have read the note, and I agree with the clinical assessment and plan.  Jeff Frieden K Amiree No   

## 2020-03-11 NOTE — Patient Instructions (Signed)
It was nice to see you today :) Continue current medications  Refills were sent  See you in 1 year

## 2020-03-11 NOTE — Progress Notes (Signed)
PATIENT: [name] DOB: 26-May-1981  REASON FOR VISIT: follow up HISTORY FROM: patient  HISTORY OF PRESENT ILLNESS: Today 03/11/20  [name] is a 39 year old female with history of migraine headache.  She remains on Zonegran, Maxalt as needed.  She could not tolerate Topamax or nortriptyline due to side effect.  Her headaches continue to do well, she reports she may have 3 headaches a month, Maxalt works well.  She says her overall health is good.  Since December, she has developed an outbreak of hives.  Will be seeing the dermatologist this week.  Nothing has changed that she can identify, no change in the manufacture, or shape or color of her pills.  She presents today for evaluation unaccompanied.  HISTORY 07/10/2019 SS: [name] is a 39 year old female with history of migraine headaches.  At last visit, Zonegran was increased to 100 mg daily, Maxalt as needed.  In the past, she could not tolerate Topamax or nortriptyline due to side effect. She knows she will get a migraine about 1 week before her period. She is getting migraine once a month. She does report some tension headache related to stress. The Maxalt will relieve the headache if she takes it early. Her overall health has been good since visit. She is a Runner, broadcasting/film/video, Insurance underwriter english as second language.  She presents today for follow-up unaccompanied.  REVIEW OF SYSTEMS: Out of a complete 14 system review of symptoms, the patient complains only of the following symptoms, and all other reviewed systems are negative.  Headache  ALLERGIES: Allergies  Allergen Reactions  . Latex Rash    HOME MEDICATIONS: Outpatient Medications Prior to Visit  Medication Sig Dispense Refill  . Desvenlafaxine Succinate ER 25 MG TB24 Take 50 mg by mouth daily.     . rizatriptan (MAXALT) 10 MG tablet Take 1 tablet (10 mg total) by mouth as needed for migraine. May repeat in 2 hours if needed 36 tablet 1  . zonisamide  (ZONEGRAN) 100 MG capsule Take 1 capsule (100 mg total) by mouth daily. 90 capsule 2  . famotidine (PEPCID) 20 MG tablet famotidine 20 mg tablet  TAKE 1 TABLET (20 MG TOTAL) BY MOUTH 2 TIMES DAILY FOR 30 DAYS.    . fexofenadine (ALLEGRA) 180 MG tablet fexofenadine 180 mg tablet  TAKE 1 TABLET (180 MG TOTAL) BY MOUTH DAILY FOR 30 DAYS.    Marland Kitchen zonisamide (ZONEGRAN) 100 MG capsule      No facility-administered medications prior to visit.    PAST MEDICAL HISTORY: Past Medical History:  Diagnosis Date  . Anxiety   . Broken arm   . Depression   . Headache   . Medical history non-contributory   . Migraine 10/08/2014  . SVD (spontaneous vaginal delivery)    x 1    PAST SURGICAL HISTORY: Past Surgical History:  Procedure Laterality Date  . CESAREAN SECTION WITH BILATERAL TUBAL LIGATION Bilateral 03/05/2013   Procedure: CESAREAN SECTION WITH BILATERAL TUBAL LIGATION;  Surgeon: Meriel Pica, MD;  Location: [facility];  Service: Obstetrics;  Laterality: Bilateral;  Primary edc 03/09/13  . DILATION AND CURETTAGE OF UTERUS    . WISDOM TOOTH EXTRACTION      FAMILY HISTORY: Family History  Problem Relation Age of Onset  . Diabetes Mellitus I Mother   . Cancer - Other Mother        breast  . Bipolar disorder Brother   . Migraines Maternal Grandmother   . Melanoma Paternal Grandfather   .  Cancer - Other Paternal Grandfather        throat    SOCIAL HISTORY: Social History   Socioeconomic History  . Marital status: Married    Spouse name: Not on file  . Number of children: 2  . Years of education: Not on file  . Highest education level: Not on file  Occupational History    Employer: [employer]  Tobacco Use  . Smoking status: Never Smoker  . Smokeless tobacco: Never Used  Substance and Sexual Activity  . Alcohol use: Yes    Comment: one drink weekly  . Drug use: No  . Sexual activity: Yes    Birth control/protection: None    Comment: pregnant  Other Topics  Concern  . Not on file  Social History Narrative   Patient is right handed   Patient drinks about 2 cups caffeine daily.   Social Determinants of Health   Financial Resource Strain:   . Difficulty of Paying Living Expenses:   Food Insecurity:   . Worried About Charity fundraiser in the Last Year:   . Arboriculturist in the Last Year:   Transportation Needs:   . Film/video editor (Medical):   Marland Kitchen Lack of Transportation (Non-Medical):   Physical Activity:   . Days of Exercise per Week:   . Minutes of Exercise per Session:   Stress:   . Feeling of Stress :   Social Connections:   . Frequency of Communication with Friends and Family:   . Frequency of Social Gatherings with Friends and Family:   . Attends Religious Services:   . Active Member of Clubs or Organizations:   . Attends Archivist Meetings:   Marland Kitchen Marital Status:   Intimate Partner Violence:   . Fear of Current or Ex-Partner:   . Emotionally Abused:   Marland Kitchen Physically Abused:   . Sexually Abused:    PHYSICAL EXAM  Vitals:   03/11/20 1540  BP: 114/74  Pulse: 81  Temp: (!) 97 F (36.1 C)  Weight: 128 lb (58.1 kg)   Body mass index is 22.67 kg/m.  Generalized: Well developed, in no acute distress   Neurological examination  Mentation: Alert oriented to time, place, history taking. Follows all commands speech and language fluent Cranial nerve II-XII: Pupils were equal round reactive to light. Extraocular movements were full, visual field were full on confrontational test. Facial sensation and strength were normal. Head turning and shoulder shrug were normal and symmetric. Motor: The motor testing reveals 5 over 5 strength of all 4 extremities. Good symmetric motor tone is noted throughout.  Sensory: Sensory testing is intact to soft touch on all 4 extremities. No evidence of extinction is noted.  Coordination: Cerebellar testing reveals good finger-nose-finger and heel-to-shin bilaterally.  Gait and  station: Gait is normal. Tandem gait is normal. Romberg is negative. No drift is seen.  Reflexes: Deep tendon reflexes are symmetric and normal bilaterally.   DIAGNOSTIC DATA (LABS, IMAGING, TESTING) - I reviewed patient records, labs, notes, testing and imaging myself where available.  Lab Results  Component Value Date   WBC 12.8 (H) 03/06/2013   HGB 11.5 (L) 03/06/2013   HCT 34.6 (L) 03/06/2013   MCV 87.6 03/06/2013   PLT 198 03/06/2013   No results found for: NA, K, CL, CO2, GLUCOSE, BUN, CREATININE, CALCIUM, PROT, ALBUMIN, AST, ALT, ALKPHOS, BILITOT, GFRNONAA, GFRAA No results found for: CHOL, HDL, LDLCALC, LDLDIRECT, TRIG, CHOLHDL No results found for: HGBA1C No  results found for: VITAMINB12 No results found for: TSH  ASSESSMENT AND PLAN 39 y.o. year old female  has a past medical history of Anxiety, Broken arm, Depression, Headache, Medical history non-contributory, Migraine (10/08/2014), and SVD (spontaneous vaginal delivery). here with:  1.  Migraine headache  Her headaches have continued to do well since last seen.  She will remain on Zonegran 100 mg daily, Maxalt as needed.  I have sent refills.  She will be seeing her dermatologist this week, regarding the rash/hives she developed since December.  She has been on Zonegran since February 2019.  She will follow-up in 1 year or sooner if needed.   I spent 15 minutes with the patient. 50% of this time was spent discussing her plan of care.   Margie Ege, AGNP-C, DNP 03/11/2020, 4:13 PM [facility] [address] [place], [place] [address] [phone]

## 2020-03-22 ENCOUNTER — Ambulatory Visit: Payer: BC Managed Care – PPO

## 2020-03-29 ENCOUNTER — Ambulatory Visit: Payer: BC Managed Care – PPO

## 2020-03-31 ENCOUNTER — Ambulatory Visit: Payer: BC Managed Care – PPO | Attending: Internal Medicine

## 2020-03-31 DIAGNOSIS — Z23 Encounter for immunization: Secondary | ICD-10-CM

## 2020-03-31 NOTE — Progress Notes (Signed)
   Covid-19 Vaccination Clinic  Name:  [name]    MRN: [id number] DOB: 04/24/1981  03/31/2020  [name] was observed post Covid-19 immunization for 15 minutes without incident. She was provided with Vaccine Information Sheet and instruction to access the V-Safe system.   [name] was instructed to call 911 with any severe reactions post vaccine: Marland Kitchen Difficulty breathing  . Swelling of face and throat  . A fast heartbeat  . A bad rash all over body  . Dizziness and weakness   Immunizations Administered    Name Date Dose VIS Date Route   Pfizer COVID-19 Vaccine 03/31/2020  8:51 AM 0.3 mL 12/07/2019 Intramuscular   Manufacturer: [employer], [employer]   Lot: [id number]   NDC: [address]-4

## 2021-03-10 NOTE — Progress Notes (Unsigned)
PATIENT: [name] DOB: [birth year]  REASON FOR VISIT: follow up HISTORY FROM: patient  HISTORY OF PRESENT ILLNESS: Today 03/11/21  Ova [name] is a 40 year old female with history of migraine headache.  She remains on Zonegran, Maxalt as needed.  Could not tolerate Topamax or nortriptyline. Having 1-3 migraines a month, but are lasting 4-5 days at a time. Last week combination of weather, menstrual cycle caused prolonged migraine. Maxalt has fair benefit. Has tension in shoulders and neck, triggers headaches that is mild to moderate, can be every other day. Seeing massage therapist, since November. Therapist is seeing progress. Years ago tried propranolol, Imitrex, Relpax, Fioricet, and Zomig.  Here today for evaluation unaccompanied.  Update 03/11/2020 SS: Ms. [name] is a 40 year old female with history of migraine headache.  She remains on Zonegran, Maxalt as needed.  She could not tolerate Topamax or nortriptyline due to side effect.  Her headaches continue to do well, she reports she may have 3 headaches a month, Maxalt works well.  She says her overall health is good.  Since December, she has developed an outbreak of hives.  Will be seeing the dermatologist this week.  Nothing has changed that she can identify, no change in the manufacture, or shape or color of her pills.  She presents today for evaluation unaccompanied.  HISTORY 07/10/2019 SS: Ms. [name] is a 40 year old female with history of migraine headaches.  At last visit, Zonegran was increased to 100 mg daily, Maxalt as needed.  In the past, she could not tolerate Topamax or nortriptyline due to side effect. She knows she will get a migraine about 1 week before her period. She is getting migraine once a month. She does report some tension headache related to stress. The Maxalt will relieve the headache if she takes it early. Her overall health has been good since visit. She is a Runner, broadcasting/film/video, Insurance underwriter english as  second language.  She presents today for follow-up unaccompanied.  REVIEW OF SYSTEMS: Out of a complete 14 system review of symptoms, the patient complains only of the following symptoms, and all other reviewed systems are negative.  Headache  ALLERGIES: Allergies  Allergen Reactions  . Latex Rash    HOME MEDICATIONS: Outpatient Medications Prior to Visit  Medication Sig Dispense Refill  . Desvenlafaxine Succinate ER 25 MG TB24 Take 50 mg by mouth daily.     . rizatriptan (MAXALT) 10 MG tablet Take 1 tablet (10 mg total) by mouth as needed for migraine. May repeat in 2 hours if needed 12 tablet 3  . zonisamide (ZONEGRAN) 100 MG capsule Take 1 capsule (100 mg total) by mouth daily. 90 capsule 4   No facility-administered medications prior to visit.    PAST MEDICAL HISTORY: Past Medical History:  Diagnosis Date  . Anxiety   . Broken arm   . Depression   . Headache   . Medical history non-contributory   . Migraine 10/08/2014  . SVD (spontaneous vaginal delivery)    x 1    PAST SURGICAL HISTORY: Past Surgical History:  Procedure Laterality Date  . CESAREAN SECTION WITH BILATERAL TUBAL LIGATION Bilateral 03/05/2013   Procedure: CESAREAN SECTION WITH BILATERAL TUBAL LIGATION;  Surgeon: Meriel Pica, MD;  Location: [facility];  Service: Obstetrics;  Laterality: Bilateral;  Primary edc 03/09/13  . DILATION AND CURETTAGE OF UTERUS    . WISDOM TOOTH EXTRACTION      FAMILY HISTORY: Family History  Problem Relation Age of Onset  . Diabetes  Mellitus I Mother   . Cancer - Other Mother        breast  . Bipolar disorder Brother   . Migraines Maternal Grandmother   . Melanoma Paternal Grandfather   . Cancer - Other Paternal Grandfather        throat    SOCIAL HISTORY: Social History   Socioeconomic History  . Marital status: Married    Spouse name: Not on file  . Number of children: 2  . Years of education: Not on file  . Highest education level: Not on file   Occupational History    Employer: [employer]  Tobacco Use  . Smoking status: Never Smoker  . Smokeless tobacco: Never Used  Substance and Sexual Activity  . Alcohol use: Yes    Comment: one drink weekly  . Drug use: No  . Sexual activity: Yes    Birth control/protection: None    Comment: pregnant  Other Topics Concern  . Not on file  Social History Narrative   Patient is right handed   Patient drinks about 2 cups caffeine daily.   Social Determinants of Health   Financial Resource Strain: Not on file  Food Insecurity: Not on file  Transportation Needs: Not on file  Physical Activity: Not on file  Stress: Not on file  Social Connections: Not on file  Intimate Partner Violence: Not on file   PHYSICAL EXAM  Vitals:   03/11/21 1517  BP: 108/68  Pulse: 88  Weight: 121 lb (54.9 kg)  Height: 5\' 3"  (1.6 m)   Body mass index is 21.43 kg/m.  Generalized: Well developed, in no acute distress   Neurological examination  Mentation: Alert oriented to time, place, history taking. Follows all commands speech and language fluent Cranial nerve II-XII: Pupils were equal round reactive to light. Extraocular movements were full, visual field were full on confrontational test. Facial sensation and strength were normal. Head turning and shoulder shrug were normal and symmetric. Motor: The motor testing reveals 5 over 5 strength of all 4 extremities. Good symmetric motor tone is noted throughout.  Sensory: Sensory testing is intact to soft touch on all 4 extremities. No evidence of extinction is noted.  Coordination: Cerebellar testing reveals good finger-nose-finger and heel-to-shin bilaterally.  Gait and station: Gait is normal. Tandem gait is normal. Romberg is negative. No drift is seen.  Reflexes: Deep tendon reflexes are symmetric and normal bilaterally.   DIAGNOSTIC DATA (LABS, IMAGING, TESTING) - I reviewed patient records, labs, notes, testing and imaging myself  where available.  Lab Results  Component Value Date   WBC 12.8 (H) 03/06/2013   HGB 11.5 (L) 03/06/2013   HCT 34.6 (L) 03/06/2013   MCV 87.6 03/06/2013   PLT 198 03/06/2013   No results found for: NA, K, CL, CO2, GLUCOSE, BUN, CREATININE, CALCIUM, PROT, ALBUMIN, AST, ALT, ALKPHOS, BILITOT, GFRNONAA, GFRAA No results found for: CHOL, HDL, LDLCALC, LDLDIRECT, TRIG, CHOLHDL No results found for: 05/06/2013 No results found for: VITAMINB12 No results found for: TSH  ASSESSMENT AND PLAN 40 y.o. year old female  has a past medical history of Anxiety, Broken arm, Depression, Headache, Medical history non-contributory, Migraine (10/08/2014), and SVD (spontaneous vaginal delivery). here with:  1.  Migraine headache  -Increase in migraine frequency, lasting several days -Add on Ajovy 225 mg monthly injection for migraine prevention -Maxalt has variable benefit, will try Nurtec as needed for acute headache -Tension in the shoulders and neck, often triggers migraine, try baclofen 5 mg  at bedtime -Continue Zonegran for now, may consider discontinuation in the future if significant improvement with Ajovy -Follow-up in 6 months or sooner if needed to determine Ajovy benefit, encouraged to reach out with my chart message with progress report  I spent 20 minutes of face-to-face and non-face-to-face time with patient.  This included previsit chart review, lab review, study review, order entry, electronic health record documentation, patient education.  Margie Ege, AGNP-C, DNP 03/11/2021, 4:08 PM [facility] [address], [address] [place], [place] [address] [phone]

## 2021-03-11 ENCOUNTER — Ambulatory Visit: Payer: BC Managed Care – PPO | Admitting: Neurology

## 2021-03-11 ENCOUNTER — Other Ambulatory Visit: Payer: Self-pay

## 2021-03-11 ENCOUNTER — Encounter: Payer: Self-pay | Admitting: Neurology

## 2021-03-11 VITALS — BP 108/68 | HR 88 | Ht 63.0 in | Wt 121.0 lb

## 2021-03-11 DIAGNOSIS — G43C0 Periodic headache syndromes in child or adult, not intractable: Secondary | ICD-10-CM

## 2021-03-11 MED ORDER — NURTEC 75 MG PO TBDP: 75.0000 mg | ORAL_TABLET | ORAL | 11 refills | Status: DC | PRN

## 2021-03-11 MED ORDER — BACLOFEN 10 MG PO TABS: 5.0000 mg | ORAL_TABLET | Freq: Every day | ORAL | 3 refills | Status: DC

## 2021-03-11 MED ORDER — AJOVY 225 MG/1.5ML ~~LOC~~ SOAJ: 225.0000 mg | SUBCUTANEOUS | 11 refills | Status: DC

## 2021-03-11 MED ORDER — ZONISAMIDE 100 MG PO CAPS: 100.0000 mg | ORAL_CAPSULE | Freq: Every day | ORAL | 4 refills | Status: DC

## 2021-03-11 NOTE — Patient Instructions (Signed)
Try the Ajovy for migraine prevention  Continue Zonegran for now Try baclofen 5 mg at bedtime for neck tension  Try Nurtec for acute headache  See you back in 6 months

## 2021-03-11 NOTE — Progress Notes (Signed)
I have read the note, and I agree with the clinical assessment and plan.  Saleha Kalp K Denorris Reust   

## 2021-03-12 ENCOUNTER — Telehealth: Payer: Self-pay | Admitting: Emergency Medicine

## 2021-03-12 NOTE — Telephone Encounter (Signed)
PA for Nurtec started on CMM Key: [facility] Awaiting determination from [employer]

## 2021-03-12 NOTE — Telephone Encounter (Signed)
PA for Ajovy started on [facility] Key: [id number] Awaiting determination from [facility]

## 2021-03-12 NOTE — Telephone Encounter (Signed)
As long as you remain covered by the [facility] and there are no changes to your plan benefits, this request is approved for the following time period: 03/12/2021 - 03/12/2022

## 2021-03-18 NOTE — Progress Notes (Signed)
Appeal letter to [facility] [employer] for Ajovy.

## 2021-03-18 NOTE — Telephone Encounter (Signed)
Received denial from [facility] [employer] stating that the Ajovy could be approved if not being used with another CGRP. I have faxed appeal letter making them aware that Nurtec is being prescribed for acute migraines, not as a preventative.   Awaiting determination.

## 2021-03-23 ENCOUNTER — Encounter: Payer: Self-pay | Admitting: Neurology

## 2021-03-24 NOTE — Telephone Encounter (Signed)
Called [employer] [facility] to check status of appeal for ajovy spoke with rep who stated I need appeals dept, ph [phone]. She transferred call, spoke with Fayrene Fearing B who stated they received appeal. He stated it was denied as invalid request. He advised I call, [phone] and he transferred call.  Spoke with [employer], pharmacy operations who stated appeal was missing third party auth form giving provider permission to file appeal on her behalf. She will fax form over, needs patient signature, then fax back. On cover sheet put PA [phone].  Fax to [phone], appeals. Received form. Called patient to inform of signature needed, LVM.

## 2021-03-24 NOTE — Telephone Encounter (Signed)
I have not received anything yet. Check with Andrey Campanile, RN as well, they may have put it with NP paperwork.

## 2021-03-31 NOTE — Telephone Encounter (Signed)
Received letter from [facility] state health plan: [name] appeal denied; patient never signed and returned third party auth form. Letter states they mailed the form to patient with instructions to sign, date and return. I called her, LVM and sent her my chart advising that I have form here for her to sign. She has never responded.

## 2021-09-17 ENCOUNTER — Ambulatory Visit: Payer: BC Managed Care – PPO | Admitting: Family Medicine

## 2021-09-17 ENCOUNTER — Encounter: Payer: Self-pay | Admitting: Family Medicine

## 2021-09-17 VITALS — BP 121/83 | HR 70 | Ht 63.0 in | Wt 125.0 lb

## 2021-09-17 DIAGNOSIS — G43009 Migraine without aura, not intractable, without status migrainosus: Secondary | ICD-10-CM | POA: Diagnosis not present

## 2021-09-17 MED ORDER — AJOVY 225 MG/1.5ML ~~LOC~~ SOAJ: 225.0000 mg | SUBCUTANEOUS | 3 refills | Status: DC

## 2021-09-17 NOTE — Patient Instructions (Signed)
Below is our plan:  We will continue zonisamide. I am going to call in Ajovy again. Use the copay card to see if this will bypass insurance denial. If we get a denial, we will have you complete patient consent to appeal. Continue Nurtec for abortive therapy.   Please make sure you are staying well hydrated. I recommend 50-60 ounces daily. Well balanced diet and regular exercise encouraged. Consistent sleep schedule with 6-8 hours recommended.   Please continue follow up with care team as directed.   Follow up with Maralyn Sago in 6 months   You may receive a survey regarding today's visit. I encourage you to leave honest feed back as I do use this information to improve patient care. Thank you for seeing me today!

## 2021-09-17 NOTE — Progress Notes (Signed)
Chief Complaint  Patient presents with   Follow-up    RM 10, alone. HA follow up.  Stopped baclofen, ineffective. Never started [employer] issues and did not follow up with our office after. Having about 2 migraines per month. Last 2.5 months has worsened. 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  Thinks this is part of the cause of worsening migraines.     HISTORY OF PRESENT ILLNESS:  09/17/21 ALL:  [name] is a 40 y.o. female here today for follow up for migraines. She continues zonisamide 100mg  daily. She was not able to start Ajovy due to insurance denial.  Nurtec does work really well for abortive therapy. She hasn't taken rizatriptan. Baclofen was not effective. She has tension headaches most days. Worsened with returning back to school. She is a . She has taken Nurtec 6 times in the past month. She feels two headaches were severe.   03/11/21 SS: [name] is a 40 year old female with history of migraine headache.  She remains on Zonegran, Maxalt as needed.  Could not tolerate Topamax or nortriptyline. Having 1-3 migraines a month, but are lasting 4-5 days at a time. Last week combination of weather, menstrual cycle caused prolonged migraine. Maxalt has fair benefit. Has tension in shoulders and neck, triggers headaches that is mild to moderate, can be every other day. Seeing massage therapist, since November. Therapist is seeing progress. Years ago tried propranolol, Imitrex, Relpax, Fioricet, and Zomig.  Here today for evaluation unaccompanied.   Update 03/11/2020 SS: [name] is a 40 year old female with history of migraine headache.  She remains on Zonegran, Maxalt as needed.  She could not tolerate Topamax or nortriptyline due to side effect.  Her headaches continue to do well, she reports she may have 3 headaches a month, Maxalt works well.  She says her overall health is good.  Since December, she has developed an outbreak of hives.  Will be seeing the  dermatologist this week.  Nothing has changed that she can identify, no change in the manufacture, or shape or color of her pills.  She presents today for evaluation unaccompanied.   HISTORY 07/10/2019 SS: [name] is a 40 year old female with history of migraine headaches.  At last visit, Zonegran was increased to 100 mg daily, Maxalt as needed.  In the past, she could not tolerate Topamax or nortriptyline due to side effect. She knows she will get a migraine about 1 week before her period. She is getting migraine once a month. She does report some tension headache related to stress. The Maxalt will relieve the headache if she takes it early. Her overall health has been good since visit. 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  She presents today for follow-up unaccompanied.   REVIEW OF SYSTEMS: Out of a complete 14 system review of symptoms, the patient complains only of the following symptoms, headaches, anxiety and all other reviewed systems are negative.   ALLERGIES: Allergies  Allergen Reactions   Latex Rash     HOME MEDICATIONS: Outpatient Medications Prior to Visit  Medication Sig Dispense Refill   Desvenlafaxine Succinate ER 25 MG TB24 Take 50 mg by mouth daily.      Rimegepant Sulfate (NURTEC) 75 MG TBDP Take 75 mg by mouth as needed (take 1 tablet at onset of headache, max is 1 tablet in 24 hours). 10 tablet 11   zonisamide (ZONEGRAN) 100 MG capsule Take 1 capsule (100 mg total) by mouth  daily. 90 capsule 4   rizatriptan (MAXALT) 10 MG tablet Take 1 tablet (10 mg total) by mouth as needed for migraine. May repeat in 2 hours if needed 12 tablet 3   baclofen (LIORESAL) 10 MG tablet Take 0.5 tablets (5 mg total) by mouth at bedtime. 30 each 3   Fremanezumab-vfrm (AJOVY) 225 MG/1.5ML SOAJ Inject 225 mg into the skin every 30 (thirty) days. (Patient not taking: Reported on 09/17/2021) 1.68 mL 11   No facility-administered medications prior to visit.      PAST MEDICAL HISTORY: Past Medical History:  Diagnosis Date   Anxiety    Broken arm    Depression    Headache    Medical history non-contributory    Migraine 10/08/2014   SVD (spontaneous vaginal delivery)    x 1     PAST SURGICAL HISTORY: Past Surgical History:  Procedure Laterality Date   CESAREAN SECTION WITH BILATERAL TUBAL LIGATION Bilateral 03/05/2013   Procedure: CESAREAN SECTION WITH BILATERAL TUBAL LIGATION;  Surgeon: Meriel Pica, MD;  Location: [facility];  Service: Obstetrics;  Laterality: Bilateral;  Primary edc 03/09/13   DILATION AND CURETTAGE OF UTERUS     WISDOM TOOTH EXTRACTION       FAMILY HISTORY: Family History  Problem Relation Age of Onset   Diabetes Mellitus I Mother    Cancer - Other Mother        breast   Bipolar disorder Brother    Migraines Maternal Grandmother    Melanoma Paternal Grandfather    Cancer - Other Paternal Grandfather        throat     SOCIAL HISTORY: Social History   Socioeconomic History   Marital status: Married    Spouse name: Not on file   Number of children: 2   Years of education: Not on file   Highest education level: Not on file  Occupational History    Employer: [employer]  Tobacco Use   Smoking status: Never   Smokeless tobacco: Never  Substance and Sexual Activity   Alcohol use: Yes    Comment: one drink weekly   Drug use: No   Sexual activity: Yes    Birth control/protection: None    Comment: pregnant  Other Topics Concern   Not on file  Social History Narrative   Patient is right handed   Patient drinks about 2 cups caffeine daily.   Social Determinants of Health   Financial Resource Strain: Not on file  Food Insecurity: Not on file  Transportation Needs: Not on file  Physical Activity: Not on file  Stress: Not on file  Social Connections: Not on file  Intimate Partner Violence: Not on file     PHYSICAL EXAM  Vitals:   09/17/21 1456  BP: 121/83  Pulse: 70   Weight: 125 lb (56.7 kg)  Height: 5\' 3"  (1.6 m)   Body mass index is 22.14 kg/m.  Generalized: Well developed, in no acute distress  Cardiology: normal rate and rhythm, no murmur auscultated  Respiratory: clear to auscultation bilaterally    Neurological examination  Mentation: Alert oriented to time, place, history taking. Follows all commands speech and language fluent Cranial nerve II-XII: Pupils were equal round reactive to light. Extraocular movements were full, visual field were full on confrontational test. Facial sensation and strength were normal. Head turning and shoulder shrug  were normal and symmetric. Motor: The motor testing reveals 5 over 5 strength of all 4 extremities. Good symmetric motor tone is  noted throughout.   Gait and station: Gait is normal.    DIAGNOSTIC DATA (LABS, IMAGING, TESTING) - I reviewed patient records, labs, notes, testing and imaging myself where available.  Lab Results  Component Value Date   WBC 12.8 (H) 03/06/2013   HGB 11.5 (L) 03/06/2013   HCT 34.6 (L) 03/06/2013   MCV 87.6 03/06/2013   PLT 198 03/06/2013   No results found for: NA, K, CL, CO2, GLUCOSE, BUN, CREATININE, CALCIUM, PROT, ALBUMIN, AST, ALT, ALKPHOS, BILITOT, GFRNONAA, GFRAA No results found for: CHOL, HDL, LDLCALC, LDLDIRECT, TRIG, CHOLHDL No results found for: HDQQ2W No results found for: VITAMINB12 No results found for: TSH  No flowsheet data found.   No flowsheet data found.   ASSESSMENT AND PLAN  40 y.o. year old female  has a past medical history of Anxiety, Broken arm, Depression, Headache, Medical history non-contributory, Migraine (10/08/2014), and SVD (spontaneous vaginal delivery). here with    Migraine without aura and without status migrainosus, not intractable  Makenzey is doing fairly well but has noted worsening migraines since returning to work. She will continue zonisamide 100mg  daily. She wishes to resubmit Ajovy prescription. I have given her 1  sample pen in the office that she prefers to administer at home. Potential side effects, dosing and proper storage reviewed. She will use copay card at pharmacy. She will listen out for to contact her for permission to appeal if needed. She will continue Nurtec for abortive therapy. Healthy lifestyle habits advised. She will follow up with [place] in 6 months.   No orders of the defined types were placed in this encounter.    Meds ordered this encounter  Medications   Fremanezumab-vfrm (AJOVY) 225 MG/1.5ML SOAJ    Sig: Inject 225 mg into the skin every 30 (thirty) days.    Dispense:  4.5 mL    Refill:  3    Order Specific Question:   Supervising Provider    Answer:   Maralyn Sago Anson Fret       [[id number]], MSN, FNP-C 09/17/2021, 4:23 PM  [facility] [address], [address] [place], [place] [phone]

## 2021-09-17 NOTE — Progress Notes (Signed)
I have read the note, and I agree with the clinical assessment and plan.  Alon Mazor K Verle Wheeling   

## 2021-09-21 ENCOUNTER — Telehealth: Payer: Self-pay

## 2021-09-21 NOTE — Telephone Encounter (Signed)
Submitted a PA request for Ajovy on [employer], Key: [id number] - PA Case ID: [id number].   Awaiting determination from [employer].

## 2021-09-22 NOTE — Telephone Encounter (Signed)
PA approved 09/21/2021 - 12/21/2021. PA# [facility] Plan [phone] Non-Grandfathered [id number].

## 2021-12-23 ENCOUNTER — Telehealth: Payer: Self-pay | Admitting: *Deleted

## 2021-12-23 NOTE — Telephone Encounter (Signed)
PA approved through 12/23/2022.

## 2021-12-23 NOTE — Telephone Encounter (Signed)
PA for Ajovy started on covermymeds (key: [id number]). Pt has coverage through [facility]. Decision pending.

## 2022-03-11 ENCOUNTER — Telehealth: Payer: Self-pay

## 2022-03-11 NOTE — Telephone Encounter (Signed)
(  Key: [id number]) ? ?Your information has been submitted to [employer]. To check for an updated outcome later, reopen this PA request from your dashboard. ? ?If [employer] has not responded to your request within 24 hours, contact [employer] at [phone]. If you think there may be a problem with your PA request, use our live chat feature at the bottom right. ?

## 2022-03-15 NOTE — Telephone Encounter (Signed)
Approved through 03/20/2023. ?

## 2022-03-17 ENCOUNTER — Encounter: Payer: Self-pay | Admitting: Neurology

## 2022-03-17 ENCOUNTER — Ambulatory Visit: Payer: BC Managed Care – PPO | Admitting: Neurology

## 2022-03-17 VITALS — BP 118/77 | HR 80 | Ht 63.0 in | Wt 126.2 lb

## 2022-03-17 DIAGNOSIS — G43009 Migraine without aura, not intractable, without status migrainosus: Secondary | ICD-10-CM

## 2022-03-17 MED ORDER — ZONISAMIDE 25 MG PO CAPS: ORAL_CAPSULE | ORAL | 0 refills | Status: DC

## 2022-03-17 MED ORDER — NURTEC 75 MG PO TBDP: 75.0000 mg | ORAL_TABLET | ORAL | 11 refills | Status: DC | PRN

## 2022-03-17 MED ORDER — AJOVY 225 MG/1.5ML ~~LOC~~ SOAJ: 225.0000 mg | SUBCUTANEOUS | 3 refills | Status: DC

## 2022-03-17 NOTE — Progress Notes (Signed)
? ? ?PATIENT: [name] ?DOB: 02-19-1981 ? ?REASON FOR VISIT: follow up for migraines ?HISTORY FROM: patient ?PRIMARY NEUROLOGIST: Dr. Delena Bali  ? ?HISTORY OF PRESENT ILLNESS: ?Today 03/17/22  ?[name] here today for follow-up.  Remains on Zonegran for headache prevention, and Ajovy, Nurtec as needed for acute headache treatment. [name] is working great! Only 1 migraine a month, Nurtec is quite beneficial for rescue. Would like to continue weaning off Zonegran.  ? ?09/17/21 ALL:  ?[name] is a 41 y.o. female here today for follow up for migraines. She continues zonisamide 100mg  daily. She was not able to start Ajovy due to insurance denial.  Nurtec does work really well for abortive therapy. She hasn't taken rizatriptan. Baclofen was not effective. She has tension headaches most days. Worsened with returning back to school. She is a . She has taken Nurtec 6 times in the past month. She feels two headaches were severe.  ? ?Update 03/11/2021 SS: [name] is a 41 year old female with history of migraine headache.  She remains on Zonegran, Maxalt as needed.  Could not tolerate Topamax or nortriptyline. Having 1-3 migraines a month, but are lasting 4-5 days at a time. Last week combination of weather, menstrual cycle caused prolonged migraine. Maxalt has fair benefit. Has tension in shoulders and neck, triggers headaches that is mild to moderate, can be every other day. Seeing massage therapist, since November. Therapist is seeing progress. Years ago tried propranolol, Imitrex, Relpax, Fioricet, and Zomig.  Here today for evaluation unaccompanied. ? ?Update 03/11/2020 SS: [name] is a 41 year old female with history of migraine headache.  She remains on Zonegran, Maxalt as needed.  She could not tolerate Topamax or nortriptyline due to side effect.  Her headaches continue to do well, she reports she may have 3 headaches a month, Maxalt works well.  She says her overall health is good.  Since  December, she has developed an outbreak of hives.  Will be seeing the dermatologist this week.  Nothing has changed that she can identify, no change in the manufacture, or shape or color of her pills.  She presents today for evaluation unaccompanied. ? ?HISTORY ?07/10/2019 SS: [name] is a 41 year old female with history of migraine headaches.  At last visit, Zonegran was increased to 100 mg daily, Maxalt as needed.  In the past, she could not tolerate Topamax or nortriptyline due to side effect. She knows she will get a migraine about 1 week before her period. She is getting migraine once a month. She does report some tension headache related to stress. The Maxalt will relieve the headache if she takes it early. Her overall health has been good since visit. 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  She presents today for follow-up unaccompanied. ? ?REVIEW OF SYSTEMS: Out of a complete 14 system review of symptoms, the patient complains only of the following symptoms, and all other reviewed systems are negative. ? ?See HPI ? ?ALLERGIES: ?Allergies  ?Allergen Reactions  ? Latex Rash  ? ? ?HOME MEDICATIONS: ?Outpatient Medications Prior to Visit  ?Medication Sig Dispense Refill  ? Desvenlafaxine Succinate ER 25 MG TB24 Take 50 mg by mouth daily.     ? Fremanezumab-vfrm (AJOVY) 225 MG/1.5ML SOAJ Inject 225 mg into the skin every 30 (thirty) days. 4.5 mL 3  ? Rimegepant Sulfate (NURTEC) 75 MG TBDP Take 75 mg by mouth as needed (take 1 tablet at onset of headache, max is 1 tablet in 24 hours).  10 tablet 11  ? zonisamide (ZONEGRAN) 100 MG capsule Take 1 capsule (100 mg total) by mouth daily. 90 capsule 4  ? ?No facility-administered medications prior to visit.  ? ? ?PAST MEDICAL HISTORY: ?Past Medical History:  ?Diagnosis Date  ? Anxiety   ? Broken arm   ? Depression   ? Headache   ? Medical history non-contributory   ? Migraine 10/08/2014  ? SVD (spontaneous vaginal delivery)   ? x 1   ? ? ?PAST SURGICAL HISTORY: ?Past Surgical History:  ?Procedure Laterality Date  ? CESAREAN SECTION WITH BILATERAL TUBAL LIGATION Bilateral 03/05/2013  ? Procedure: CESAREAN SECTION WITH BILATERAL TUBAL LIGATION;  Surgeon: Meriel Picaichard M Holland, MD;  Location: [facility];  Service: Obstetrics;  Laterality: Bilateral;  Primary ?edc 03/09/13  ? DILATION AND CURETTAGE OF UTERUS    ? WISDOM TOOTH EXTRACTION    ? ? ?FAMILY HISTORY: ?Family History  ?Problem Relation Age of Onset  ? Diabetes Mellitus I Mother   ? Cancer - Other Mother   ?     breast  ? Bipolar disorder Brother   ? Migraines Maternal Grandmother   ? Melanoma Paternal Grandfather   ? Cancer - Other Paternal Grandfather   ?     throat  ? ? ?SOCIAL HISTORY: ?Social History  ? ?Socioeconomic History  ? Marital status: Married  ?  Spouse name: Not on file  ? Number of children: 2  ? Years of education: Not on file  ? Highest education level: Not on file  ?Occupational History  ?  Employer: [employer]  ?Tobacco Use  ? Smoking status: Never  ? Smokeless tobacco: Never  ?Substance and Sexual Activity  ? Alcohol use: Yes  ?  Comment: one drink weekly  ? Drug use: No  ? Sexual activity: Yes  ?  Birth control/protection: None  ?  Comment: pregnant  ?Other Topics Concern  ? Not on file  ?Social History Narrative  ? Patient is right handed  ? Patient drinks about 2 cups caffeine daily.  ? ?Social Determinants of Health  ? ?Financial Resource Strain: Not on file  ?Food Insecurity: Not on file  ?Transportation Needs: Not on file  ?Physical Activity: Not on file  ?Stress: Not on file  ?Social Connections: Not on file  ?Intimate Partner Violence: Not on file  ? ?PHYSICAL EXAM ? ?Vitals:  ? 03/17/22 1540  ?BP: 118/77  ?Pulse: 80  ?Weight: 126 lb 3.2 oz (57.2 kg)  ?Height: 5\' 3"  (1.6 m)  ? ? ?Body mass index is 22.36 kg/m?. ? ?Generalized: Well developed, in no acute distress  ? ?Neurological examination  ?Mentation: Alert oriented to time, place, history taking. Follows  all commands speech and language fluent ?Cranial nerve II-XII: Pupils were equal round reactive to light. Extraocular movements were full, visual field were full on confrontational test. Facial sensation and strength were normal. Head turning and shoulder shrug were normal and symmetric. ?Motor: The motor testing reveals 5 over 5 strength of all 4 extremities. Good symmetric motor tone is noted throughout.  ?Sensory: Sensory testing is intact to soft touch on all 4 extremities. No evidence of extinction is noted.  ?Coordination: Cerebellar testing reveals good finger-nose-finger and heel-to-shin bilaterally.  ?Gait and station: Gait is normal.  ?Reflexes: Deep tendon reflexes are symmetric and normal bilaterally.  ? ?DIAGNOSTIC DATA (LABS, IMAGING, TESTING) ?- I reviewed patient records, labs, notes, testing and imaging myself where available. ? ?Lab Results  ?Component Value Date  ?  WBC 12.8 (H) 03/06/2013  ? HGB 11.5 (L) 03/06/2013  ? HCT 34.6 (L) 03/06/2013  ? MCV 87.6 03/06/2013  ? PLT 198 03/06/2013  ? ?No results found for: NA, K, CL, CO2, GLUCOSE, BUN, CREATININE, CALCIUM, PROT, ALBUMIN, AST, ALT, ALKPHOS, BILITOT, GFRNONAA, GFRAA ?No results found for: CHOL, HDL, LDLCALC, LDLDIRECT, TRIG, CHOLHDL ?No results found for: HGBA1C ?No results found for: VITAMINB12 ?No results found for: TSH ? ?ASSESSMENT AND PLAN ?41 y.o. year old female  has a past medical history of Anxiety, Broken arm, Depression, Headache, Medical history non-contributory, Migraine (10/08/2014), and SVD (spontaneous vaginal delivery). here with: ? ?1.  Migraine headache ? ?-Tola is doing great, only 1 migraine a month ?-Continue Ajovy 225 mg monthly injection for migraine prevention ?-Continue Nurtec as needed for acute headache therapy ?-Wean down Zonegran slowly using 25 mg tablets  ?-Follow-up in 1 year or sooner if needed ? ?Meds ordered this encounter  ?Medications  ? zonisamide (ZONEGRAN) 25 MG capsule  ?  Sig: Take 3 capsules at  bedtime for 2 weeks, then take 2 capsules at bedtime for 2 weeks, then take 1 capsule at bedtime for 2 weeks then stop  ?  Dispense:  90 capsule  ?  Refill:  0  ? Rimegepant Sulfate (NURTEC) 75 MG TBDP  ?  Sig: T

## 2022-03-17 NOTE — Patient Instructions (Signed)
Meds ordered this encounter  ?Medications  ? zonisamide (ZONEGRAN) 25 MG capsule  ?  Sig: Take 3 capsules at bedtime for 2 weeks, then take 2 capsules at bedtime for 2 weeks, then take 1 capsule at bedtime for 2 weeks then stop  ?  Dispense:  90 capsule  ?  Refill:  0  ? Rimegepant Sulfate (NURTEC) 75 MG TBDP  ?  Sig: Take 75 mg by mouth as needed (take 1 tablet at onset of headache, max is 1 tablet in 24 hours).  ?  Dispense:  10 tablet  ?  Refill:  11  ? Fremanezumab-vfrm (AJOVY) 225 MG/1.5ML SOAJ  ?  Sig: Inject 225 mg into the skin every 30 (thirty) days.  ?  Dispense:  4.5 mL  ?  Refill:  3  ? ? ?

## 2022-04-09 ENCOUNTER — Other Ambulatory Visit: Payer: Self-pay | Admitting: Neurology

## 2022-04-22 ENCOUNTER — Other Ambulatory Visit: Payer: Self-pay | Admitting: Neurology

## 2022-05-19 ENCOUNTER — Other Ambulatory Visit: Payer: Self-pay | Admitting: Neurology

## 2022-05-20 NOTE — Telephone Encounter (Signed)
Checking with pt as she was supposed to wean off this medication. May not need to refill.

## 2023-03-23 ENCOUNTER — Telehealth: Payer: BC Managed Care – PPO | Admitting: Neurology

## 2023-03-26 ENCOUNTER — Other Ambulatory Visit: Payer: Self-pay | Admitting: Neurology

## 2023-04-19 NOTE — Progress Notes (Unsigned)
   Virtual Visit via Video Note  I connected with [name] on 04/19/23 at  4:00 PM EDT by a video enabled telemedicine application and verified that I am speaking with the correct person using two identifiers.  Location: Patient: at her home Provider: in the office    I discussed the limitations of evaluation and management by telemedicine and the availability of in person appointments. The patient expressed understanding and agreed to proceed.  History of Present Illness: Today April 20, 2023 SS: Doing well, 2 migraines a month. Stopped the Zonegran. Takes the [employer]	[place], works well. Does get Ajovy as 3 month supply, sometimes pharmacy is late getting it to her.   03/17/22 SS: [name] here today for follow-up.  Remains on Zonegran for headache prevention, and Ajovy, Nurtec as needed for acute headache treatment. [name] is working great! Only 1 migraine a month, Nurtec is quite beneficial for rescue. Would like to continue weaning off Zonegran    Observations/Objective: Via video visit, is alert and oriented, speech is clear and concise, facial symmetry noted, moves about freely  Assessment and Plan:  1.  Chronic migraine headache -Migraines under excellent control -Continue Ajovy monthly injection for migraine prevention -Continue Nurtec as needed for acute headache  Meds ordered this encounter  Medications   Rimegepant Sulfate (NURTEC) 75 MG TBDP    Sig: Take 1 tablet (75 mg total) by mouth as needed (take 1 tablet at onset of headache, max is 1 tablet in 24 hours).    Dispense:  10 tablet    Refill:  11     Follow Up Instructions: 1 year MyChart visit   I discussed the assessment and treatment plan with the patient. The patient was provided an opportunity to ask questions and all were answered. The patient agreed with the plan and demonstrated an understanding of the instructions.   The patient was advised to call back or seek an in-person evaluation if the symptoms worsen  or if the condition fails to improve as anticipated.   Otila Kluver, DNP  [facility] [address], [address] [place], [place] [address] [phone]

## 2023-04-20 ENCOUNTER — Telehealth: Payer: BC Managed Care – PPO | Admitting: Neurology

## 2023-04-20 DIAGNOSIS — G43009 Migraine without aura, not intractable, without status migrainosus: Secondary | ICD-10-CM | POA: Diagnosis not present

## 2023-04-20 MED ORDER — NURTEC 75 MG PO TBDP: 75.0000 mg | ORAL_TABLET | ORAL | 11 refills | Status: DC | PRN

## 2023-05-20 ENCOUNTER — Other Ambulatory Visit (HOSPITAL_COMMUNITY): Payer: Self-pay

## 2023-05-20 ENCOUNTER — Telehealth: Payer: Self-pay

## 2023-05-20 NOTE — Telephone Encounter (Signed)
Patient Advocate Encounter   Received notification from [employer] that prior authorization is required for Nurtec 75MG  dispersible tablets   Submitted: 05-20-2023 Key [id number]  Status is pending

## 2023-05-22 ENCOUNTER — Telehealth: Payer: Self-pay

## 2023-05-22 ENCOUNTER — Other Ambulatory Visit (HOSPITAL_COMMUNITY): Payer: Self-pay

## 2023-05-22 NOTE — Telephone Encounter (Signed)
Pharmacy Patient Advocate Encounter   Received notification from [facility] that prior authorization for AJOVY (fremanezumab-vfrm) injection 225MG /1.5ML auto-injectors is required/requested.   PA submitted on 05/22/2023 to (ins) [facility] via CoverMyMeds Key or (Medicaid) confirmation # [id number] Status is pending

## 2023-05-22 NOTE — Telephone Encounter (Signed)
Pharmacy Patient Advocate Encounter  Prior Authorization for Nurtec 75MG  dispersible tablets has been approved by [employer] (ins).    PA # PA Case ID #: [id number] Effective dates: 05/20/2023 through 05/18/2024

## 2023-05-23 ENCOUNTER — Other Ambulatory Visit (HOSPITAL_COMMUNITY): Payer: Self-pay

## 2023-05-23 NOTE — Telephone Encounter (Signed)
Pharmacy Patient Advocate Encounter  Prior Authorization for AJOVY (fremanezumab-vfrm) injection 225MG /1.5ML auto-injectors has been approved by [employer] (ins).    PA # PA Case ID #: [id number] Effective dates: 05/22/2023 through 05/21/2024

## 2023-05-24 ENCOUNTER — Other Ambulatory Visit (HOSPITAL_COMMUNITY): Payer: Self-pay

## 2024-02-20 ENCOUNTER — Ambulatory Visit: Payer: Self-pay

## 2024-04-25 ENCOUNTER — Telehealth: Payer: BC Managed Care – PPO | Admitting: Neurology

## 2024-04-25 DIAGNOSIS — G43709 Chronic migraine without aura, not intractable, without status migrainosus: Secondary | ICD-10-CM

## 2024-04-25 DIAGNOSIS — G43009 Migraine without aura, not intractable, without status migrainosus: Secondary | ICD-10-CM

## 2024-04-25 MED ORDER — QULIPTA 60 MG PO TABS: 60.0000 mg | ORAL_TABLET | Freq: Every day | ORAL | 11 refills | Status: AC

## 2024-04-25 MED ORDER — UBRELVY 100 MG PO TABS: 100.0000 mg | ORAL_TABLET | ORAL | 11 refills | Status: AC | PRN

## 2024-04-25 NOTE — Progress Notes (Signed)
   Virtual Visit via Video Note  I connected with [name] on 04/25/24 at  4:00 PM EDT by a video enabled telemedicine application and verified that I am speaking with the correct person using two identifiers.  Location: Patient: at her home Provider: in the office    I discussed the limitations of evaluation and management by telemedicine and the availability of in person appointments. The patient expressed understanding and agreed to proceed.  History of Present Illness: Update 04/25/24 SS: Via VV. Doing well up until the last 2 months, more migraines, Nurtec has variable benefit, if no benefit, in the bed the rest of the day in dark room. Having 4 migraines a month. Remains on Ajovy . Wonders about hormonal shift going on? Does sometimes forget to take Ajovy  and takes late, would be easier to take pills.   Today April 20, 2023 SS: Doing well, 2 migraines a month. Stopped the Zonegran . Takes the Nurtec, works well. Does get Ajovy  as 3 month supply, sometimes pharmacy is late getting it to her.   03/17/22 SS: [name] here today for follow-up.  Remains on Zonegran  for headache prevention, and Ajovy , Nurtec as needed for acute headache treatment. Ajovy  is working great! Only 1 migraine a month, Nurtec is quite beneficial for rescue. Would like to continue weaning off Zonegran     Observations/Objective: Via video visit, is alert and oriented, speech is clear and concise, facial symmetry noted, moves about freely  Assessment and Plan:  1.  Chronic migraine headache  -Increase in migraines, needle fatigue with Ajovy  -Switch to Qulipta 60 mg daily for migraine prevention -Try Ubrelvy 100 mg at onset of acute migraine -Previously tried and failed: Propranolol, Imitrex , Relpax, Fioricet, Zomig, nortriptyline , Topamax , Zonegran , Nurtec, Ajovy  - Follow-up about 6 months for video visit  Follow Up Instructions: 6 months   I discussed the assessment and treatment plan with the patient. The  patient was provided an opportunity to ask questions and all were answered. The patient agreed with the plan and demonstrated an understanding of the instructions.   The patient was advised to call back or seek an in-person evaluation if the symptoms worsen or if the condition fails to improve as anticipated.  Cortland Ding, DNP  [facility] [address], [address] [place], [place] [address] [phone]

## 2024-04-25 NOTE — Patient Instructions (Signed)
 We will stop Ajovy , switch to Qulipta 60 mg daily.  Try Florette Hurry at onset of acute migraine, may combine with Aleve. Take 1 tablet at onset of headache, may repeat in 2 hours if needed. Max is 200 mg in 24 hours.

## 2024-04-26 ENCOUNTER — Other Ambulatory Visit (HOSPITAL_COMMUNITY): Payer: Self-pay

## 2024-04-26 ENCOUNTER — Telehealth: Payer: Self-pay

## 2024-04-26 NOTE — Telephone Encounter (Signed)
 Pharmacy Patient Advocate Encounter   Received notification from CoverMy[employer]eds that prior authorization for Ubrelvy  100MG  tablets is required/requested.   Insurance verification completed.   The patient is insured through [facility] .   Per test claim: PA required; PA submitted to above mentioned insurance via CoverMyMeds Key/confirmation #/EOC [id number] Status is pending

## 2024-04-26 NOTE — Telephone Encounter (Signed)
 Pharmacy Patient Advocate Encounter   Received notification from CoverMyMeds that prior authorization for Qulipta  is required/requested.   Insurance verification completed.   The patient is insured through [facility] .   Per test claim: PA required; PA submitted to above mentioned insurance via CoverMyMeds Key/confirmation #/EOC [id number] Status is pending

## 2024-04-27 ENCOUNTER — Other Ambulatory Visit (HOSPITAL_COMMUNITY): Payer: Self-pay

## 2024-04-27 NOTE — Telephone Encounter (Signed)
 Pharmacy Patient Advocate Encounter  Received notification from [facility] that Prior Authorization for Ubrelvy  100MG  tablets has been APPROVED from 04/26/2024 to 04/26/2025. Ran test claim, Copay is $0. This test claim was processed through [facility]- copay amounts may vary at other pharmacies due to pharmacy/plan contracts, or as the patient moves through the different stages of their insurance plan.   PA #/Case ID/Reference #: PA Case ID #: [id number]

## 2024-04-27 NOTE — Telephone Encounter (Signed)
 Pharmacy Patient Advocate Encounter  Received notification from [facility] that Prior Authorization for Qulipta  60MG  tablets has been APPROVED from 04/27/2024 to 07/28/2024. Ran test claim, Copay is $0. This test claim was processed through [facility]- copay amounts may vary at other pharmacies due to pharmacy/plan contracts, or as the patient moves through the different stages of their insurance plan.   PA #/Case ID/Reference #: PA Case ID #: [id number]

## 2024-07-02 ENCOUNTER — Other Ambulatory Visit (HOSPITAL_COMMUNITY): Payer: Self-pay

## 2024-07-24 ENCOUNTER — Telehealth: Payer: Self-pay | Admitting: Pharmacy Technician

## 2024-07-24 NOTE — Telephone Encounter (Signed)
 Pharmacy Patient Advocate Encounter   Received notification from CoverMyMeds that prior authorization for Qulipta  60MG  tablets is required/requested.   Insurance verification completed.   The patient is insured through [facility] .   Per test claim: PA required; PA started via CoverMyMeds. KEY [id number] . Waiting for clinical questions to populate.

## 2024-07-25 ENCOUNTER — Other Ambulatory Visit (HOSPITAL_COMMUNITY): Payer: Self-pay

## 2024-07-25 NOTE — Telephone Encounter (Signed)
 Pharmacy Patient Advocate Encounter  Received notification from [facility] that Prior Authorization for Qulipta  60MG  tablets  has been APPROVED from 07/24/2024 to 07/28/2024. Ran test claim, Copay is $30.00. This test claim was processed through [facility]- copay amounts may vary at other pharmacies due to pharmacy/plan contracts, or as the patient moves through the different stages of their insurance plan.

## 2024-07-30 ENCOUNTER — Other Ambulatory Visit (HOSPITAL_COMMUNITY): Payer: Self-pay

## 2024-08-28 ENCOUNTER — Other Ambulatory Visit (HOSPITAL_COMMUNITY): Payer: Self-pay

## 2024-08-28 ENCOUNTER — Telehealth: Payer: Self-pay

## 2024-08-28 NOTE — Telephone Encounter (Signed)
 Pharmacy Patient Advocate Encounter   Received notification from CoverMyMeds that prior authorization for Qulipta  is required/requested.   Insurance verification completed.   The patient is insured through [facility] .   Per test claim: PA required; PA submitted to above mentioned insurance via Latent Key/confirmation #/EOC [facility] Status is pending

## 2024-08-31 NOTE — Telephone Encounter (Signed)
   PT has not been evaluated since starting Qulipta -please advise.

## 2024-09-10 ENCOUNTER — Other Ambulatory Visit (HOSPITAL_COMMUNITY): Payer: Self-pay

## 2024-09-10 NOTE — Telephone Encounter (Signed)
 [id number]- KEY  Previous clinical questions expired.

## 2024-09-11 ENCOUNTER — Other Ambulatory Visit (HOSPITAL_COMMUNITY): Payer: Self-pay

## 2024-09-12 NOTE — Telephone Encounter (Signed)
 Pharmacy Patient Advocate Encounter  Received notification from [facility] that Prior Authorization for Qulipta  has been APPROVED from 09/11/2024 to 09/11/2025   PA #/Case ID/Reference #: [id number]

## 2025-01-02 ENCOUNTER — Telehealth: Payer: Self-pay | Admitting: Neurology

## 2025-01-02 NOTE — Telephone Encounter (Signed)
 Pt has cx due to conflict, will call back to r/s.

## 2025-01-03 ENCOUNTER — Telehealth: Admitting: Neurology
